# Patient Record
Sex: Female | Born: 1995 | Race: Black or African American | Hispanic: No | Marital: Single | Smoking: Never smoker
Health system: Southern US, Community
[De-identification: ages and names within clinical notes are randomized; demographics above are authoritative.]

## PROBLEM LIST (undated history)

## (undated) DIAGNOSIS — J45909 Unspecified asthma, uncomplicated: Secondary | ICD-10-CM

## (undated) HISTORY — PX: WISDOM TOOTH EXTRACTION: SHX21

## (undated) HISTORY — DX: Unspecified asthma, uncomplicated: J45.909

---
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
## 2015-11-02 ENCOUNTER — Encounter: Payer: Self-pay | Admitting: Certified Nurse Midwife

## 2015-11-02 ENCOUNTER — Ambulatory Visit (INDEPENDENT_AMBULATORY_CARE_PROVIDER_SITE_OTHER): Payer: 59 | Admitting: Certified Nurse Midwife

## 2015-11-02 VITALS — BP 110/60 | HR 70 | Resp 16 | Ht 62.75 in | Wt 185.0 lb

## 2015-11-02 DIAGNOSIS — Z0000 Encounter for general adult medical examination without abnormal findings: Secondary | ICD-10-CM | POA: Diagnosis not present

## 2015-11-02 DIAGNOSIS — Z01419 Encounter for gynecological examination (general) (routine) without abnormal findings: Secondary | ICD-10-CM | POA: Diagnosis not present

## 2015-11-02 DIAGNOSIS — Z308 Encounter for other contraceptive management: Secondary | ICD-10-CM | POA: Diagnosis not present

## 2015-11-02 DIAGNOSIS — Z113 Encounter for screening for infections with a predominantly sexual mode of transmission: Secondary | ICD-10-CM

## 2015-11-02 LAB — POCT URINALYSIS DIPSTICK
Bilirubin, UA: NEGATIVE
Blood, UA: NEGATIVE
Glucose, UA: NEGATIVE
Ketones, UA: NEGATIVE
Leukocytes, UA: NEGATIVE
Nitrite, UA: NEGATIVE
Protein, UA: NEGATIVE
Urobilinogen, UA: NEGATIVE
pH, UA: 5

## 2015-11-02 LAB — HEMOGLOBIN A1C
Hgb A1c MFr Bld: 5.3 % (ref <5.7)
Mean Plasma Glucose: 105 mg/dL

## 2015-11-02 MED ORDER — ETONOGESTREL-ETHINYL ESTRADIOL 0.12-0.015 MG/24HR VA RING: 1.0000 | VAGINAL_RING | VAGINAL | 12 refills | Status: DC

## 2015-11-02 NOTE — Progress Notes (Signed)
20 y.o. G0P0000 Single  African American Fe here to establish gyn care, first gyn exam, and  for annual exam. Periods usually regular, but last period a little late. Duration 6 days, heavy day 1-3 and remainder light, tampons usually. Cramping. Contraception condoms. Previous contraception OCP with Pediatrician and was taking continuous pills, then switched on Microgestin 1/20 worked fairly well. Stopped because she aged out of practice.Sexually active with partner change desires STD screening. Desires contraception change ? Nuvaring. Sees Urgent care if needed.  Has been working on weight loss, down 20 pounds. No other health issues. Going back to college soon at [employer].  Patient's last menstrual period was 10/21/2015 (exact date).          Sexually active: Yes.    The current method of family planning is condoms sometimes.    Exercising: Yes.    weights & cardio Smoker:  no  Health Maintenance: Pap:  none MMG:  none Colonoscopy:  none BMD:   none TDaP:  2015 Shingles: no Pneumonia: no Hep C and HIV: not done Labs: poct urine, hgb-11.5 Self breast exam: not done   reports that she has never smoked. She has never used smokeless tobacco. She reports that she does not drink alcohol or use drugs.  Past Medical History:  Diagnosis Date  . Asthma     Past Surgical History:  Procedure Laterality Date  . WISDOM TOOTH EXTRACTION      No current outpatient prescriptions on file.   No current facility-administered medications for this visit.     History reviewed. No pertinent family history.  ROS:  Pertinent items are noted in HPI.  Otherwise, a comprehensive ROS was negative.  Exam:   BP 110/60   Pulse 70   Resp 16   Ht 5' 2.75" (1.594 m)   Wt 185 lb (83.9 kg)   LMP 10/21/2015 (Exact Date)   BMI 33.03 kg/m  Height: 5' 2.75" (159.4 cm) Ht Readings from Last 3 Encounters:  11/02/15 5' 2.75" (1.594 m) (27 %, Z= -0.61)*   * Growth percentiles are based on CDC 2-20 Years  data.    General appearance: alert, cooperative and appears stated age Head: Normocephalic, without obvious abnormality, atraumatic Neck: no adenopathy, supple, symmetrical, trachea midline and thyroid normal to inspection and palpation Lungs: clear to auscultation bilaterally Breasts: normal appearance, no masses or tenderness, No nipple retraction or dimpling, No nipple discharge or bleeding, No axillary or supraclavicular adenopathy Heart: regular rate and rhythm Abdomen: soft, non-tender; no masses,  no organomegaly Extremities: extremities normal, atraumatic, no cyanosis or edema Skin: Skin color, texture, turgor normal. No rashes or lesions Lymph nodes: Cervical, supraclavicular, and axillary nodes normal. No abnormal inguinal nodes palpated Neurologic: Grossly normal   Pelvic: External genitalia:  no lesions              Urethra:  normal appearing urethra with no masses, tenderness or lesions              Bartholin's and Skene's: normal                 Vagina: normal appearing vagina with normal color and discharge, no lesions              Cervix: no cervical motion tenderness, no lesions and nulliparous appearance              Pap taken: No. Bimanual Exam:  Uterus:  normal size, contour, position, consistency, mobility, non-tender  Adnexa: normal adnexa and no mass, fullness, tenderness               Rectovaginal: Confirms               Anus:  normal   Chaperone present: yes  A:  Well Woman with normal exam  Contraception desired, using condoms  STD screening  P:   Reviewed health and wellness pertinent to exam  Discussed contraceptive options, patient interested in Nuvaring. Discussed risks,benefits and insertion removal. Questions answered at length. Patient inserted and removed Nuvaring without problems. Needs appointment in 3 months for evaluation of use.  Rx Nuvaring see order with instructions.   Lab: HIV,RPR, Affirm/ GC,Chlamydia  Pap smear as above  not taken   counseled on breast self exam, STD prevention, HIV risk factors and prevention, family planning choices, adequate intake of calcium and vitamin D, diet and exercise  return annually or prn  An After Visit Summary was printed and given to the patient.

## 2015-11-02 NOTE — Patient Instructions (Signed)
General topics  Next pap or exam is  due in 1 year Take a Women's multivitamin Take 1200 mg. of calcium daily - prefer dietary If any concerns in interim to call back  Breast Self-Awareness Practicing breast self-awareness may pick up problems early, prevent significant medical complications, and possibly save your life. By practicing breast self-awareness, you can become familiar with how your breasts look and feel and if your breasts are changing. This allows you to notice changes early. It can also offer you some reassurance that your breast health is good. One way to learn what is normal for your breasts and whether your breasts are changing is to do a breast self-exam. If you find a lump or something that was not present in the past, it is best to contact your caregiver right away. Other findings that should be evaluated by your caregiver include nipple discharge, especially if it is bloody; skin changes or reddening; areas where the skin seems to be pulled in (retracted); or new lumps and bumps. Breast pain is seldom associated with cancer (malignancy), but should also be evaluated by a caregiver. BREAST SELF-EXAM The best time to examine your breasts is 5 7 days after your menstrual period is over.  ExitCare Patient Information 2013 [employer] LLC.   Exercise to Stay Healthy Exercise helps you become and stay healthy. EXERCISE IDEAS AND TIPS Choose exercises that:  You enjoy.  Fit into your day. You do not need to exercise really hard to be healthy. You can do exercises at a slow or medium level and stay healthy. You can:  Stretch before and after working out.  Try yoga, Pilates, or tai chi.  Lift weights.  Walk fast, swim, jog, run, climb stairs, bicycle, dance, or rollerskate.  Take aerobic classes. Exercises that burn about 150 calories:  Running 1  miles in 15 minutes.  Playing volleyball for 45 to 60 minutes.  Washing and waxing a car for 45 to 60  minutes.  Playing touch football for 45 minutes.  Walking 1  miles in 35 minutes.  Pushing a stroller 1  miles in 30 minutes.  Playing basketball for 30 minutes.  Raking leaves for 30 minutes.  Bicycling 5 miles in 30 minutes.  Walking 2 miles in 30 minutes.  Dancing for 30 minutes.  Shoveling snow for 15 minutes.  Swimming laps for 20 minutes.  Walking up stairs for 15 minutes.  Bicycling 4 miles in 15 minutes.  Gardening for 30 to 45 minutes.  Jumping rope for 15 minutes.  Washing windows or floors for 45 to 60 minutes. Document Released: 04/27/2010 Document Revised: 06/17/2011 Document Reviewed: 04/27/2010 ExitCare Patient Information 2013 ExitCare[employer].   Other topics ( that may be useful information):    Sexually Transmitted Disease Sexually transmitted disease (STD) refers to any infection that is passed from person to person during sexual activity. This may happen by way of saliva, semen, blood, vaginal mucus, or urine. Common STDs include:  Gonorrhea.  Chlamydia.  Syphilis.  HIV/AIDS.  Genital herpes.  Hepatitis B and C.  Trichomonas.  Human papillomavirus (HPV).  Pubic lice. CAUSES  An STD may be spread by bacteria, virus, or parasite. A person can get an STD by:  Sexual intercourse with an infected person.  Sharing sex toys with an infected person.  Sharing needles with an infected person.  Having intimate contact with the genitals, mouth, or rectal areas of an infected person. SYMPTOMS  Some people may not have any symptoms, but   they can still pass the infection to others. Different STDs have different symptoms. Symptoms include:  Painful or bloody urination.  Pain in the pelvis, abdomen, vagina, anus, throat, or eyes.  Skin rash, itching, irritation, growths, or sores (lesions). These usually occur in the genital or anal area.  Abnormal vaginal discharge.  Penile discharge in men.  Soft, flesh-colored skin growths in the  genital or anal area.  Fever.  Pain or bleeding during sexual intercourse.  Swollen glands in the groin area.  Yellow skin and eyes (jaundice). This is seen with hepatitis. DIAGNOSIS  To make a diagnosis, your caregiver may:  Take a medical history.  Perform a physical exam.  Take a specimen (culture) to be examined.  Examine a sample of discharge under a microscope.  Perform blood test TREATMENT   Chlamydia, gonorrhea, trichomonas, and syphilis can be cured with antibiotic medicine.  Genital herpes, hepatitis, and HIV can be treated, but not cured, with prescribed medicines. The medicines will lessen the symptoms.  Genital warts from HPV can be treated with medicine or by freezing, burning (electrocautery), or surgery. Warts may come back.  HPV is a virus and cannot be cured with medicine or surgery.However, abnormal areas may be followed very closely by your caregiver and may be removed from the cervix, vagina, or vulva through office procedures or surgery. If your diagnosis is confirmed, your recent sexual partners need treatment. This is true even if they are symptom-free or have a negative culture or evaluation. They should not have sex until their caregiver says it is okay. HOME CARE INSTRUCTIONS  All sexual partners should be informed, tested, and treated for all STDs.  Take your antibiotics as directed. Finish them even if you start to feel better.  Only take over-the-counter or prescription medicines for pain, discomfort, or fever as directed by your caregiver.  Rest.  Eat a balanced diet and drink enough fluids to keep your urine clear or pale yellow.  Do not have sex until treatment is completed and you have followed up with your caregiver. STDs should be checked after treatment.  Keep all follow-up appointments, Pap tests, and blood tests as directed by your caregiver.  Only use latex condoms and water-soluble lubricants during sexual activity. Do not use  petroleum jelly or oils.  Avoid alcohol and illegal drugs.  Get vaccinated for HPV and hepatitis. If you have not received these vaccines in the past, talk to your caregiver about whether one or both might be right for you.  Avoid risky sex practices that can break the skin. The only way to avoid getting an STD is to avoid all sexual activity.Latex condoms and dental dams (for oral sex) will help lessen the risk of getting an STD, but will not completely eliminate the risk. SEEK MEDICAL CARE IF:   You have a fever.  You have any new or worsening symptoms. Document Released: 06/15/2002 Document Revised: 06/17/2011 Document Reviewed: 06/22/2010 [facility] Patient Information 2013 [place].    Domestic Abuse You are being battered or abused if someone close to you hits, pushes, or physically hurts you in any way. You also are being abused if you are forced into activities. You are being sexually abused if you are forced to have sexual contact of any kind. You are being emotionally abused if you are made to feel worthless or if you are constantly threatened. It is important to remember that help is available. No one has the right to abuse you. PREVENTION OF FURTHER  ABUSE  Learn the warning signs of danger. This varies with situations but may include: the use of alcohol, threats, isolation from friends and family, or forced sexual contact. Leave if you feel that violence is going to occur.  If you are attacked or beaten, report it to the police so the abuse is documented. You do not have to press charges. The police can protect you while you or the attackers are leaving. Get the officer's name and badge number and a copy of the report.  Find someone you can trust and tell them what is happening to you: your caregiver, a nurse, clergy member, close friend or family member. Feeling ashamed is natural, but remember that you have done nothing wrong. No one deserves abuse. Document Released:  03/22/2000 Document Revised: 06/17/2011 Document Reviewed: 05/31/2010 ExitCare Patient Information 2013 [employer]Care[employer].    How Much is Too Much Alcohol? Drinking too much alcohol can cause injury, accidents, and health problems. These types of problems can include:   Car crashes.  Falls.  Family fighting (domestic violence).  Drowning.  Fights.  Injuries.  Burns.  Damage to certain organs.  Having a baby with birth defects. ONE DRINK CAN BE TOO MUCH WHEN YOU ARE:  Working.  Pregnant or breastfeeding.  Taking medicines. Ask your doctor.  Driving or planning to drive. If you or someone you know has a drinking problem, get help from a doctor.  Document Released: 01/19/2009 Document Revised: 06/17/2011 Document Reviewed: 01/19/2009 ExitCare Patient Information 2013 ExitCare[employer].   Smoking Hazards Smoking cigarettes is extremely bad for your health. Tobacco smoke has over 200 known poisons in it. There are over 60 chemicals in tobacco smoke that cause cancer. Some of the chemicals found in cigarette smoke include:   Cyanide.  Benzene.  Formaldehyde.  Methanol (wood alcohol).  Acetylene (fuel used in welding torches).  Ammonia. Cigarette smoke also contains the poisonous gases nitrogen oxide and carbon monoxide.  Cigarette smokers have an increased risk of many serious medical problems and Smoking causes approximately:  90% of all lung cancer deaths in men.  80% of all lung cancer deaths in women.  90% of deaths from chronic obstructive lung disease. Compared with nonsmokers, smoking increases the risk of:  Coronary heart disease by 2 to 4 times.  Stroke by 2 to 4 times.  Men developing lung cancer by 23 times.  Women developing lung cancer by 13 times.  Dying from chronic obstructive lung diseases by 12 times.  . Smoking is the most preventable cause of death and disease in our society.  WHY IS SMOKING ADDICTIVE?  Nicotine is the chemical  agent in tobacco that is capable of causing addiction or dependence.  When you smoke and inhale, nicotine is absorbed rapidly into the bloodstream through your lungs. Nicotine absorbed through the lungs is capable of creating a powerful addiction. Both inhaled and non-inhaled nicotine may be addictive.  Addiction studies of cigarettes and spit tobacco show that addiction to nicotine occurs mainly during the teen years, when young people begin using tobacco products. WHAT ARE THE BENEFITS OF QUITTING?  There are many health benefits to quitting smoking.   Likelihood of developing cancer and heart disease decreases. Health improvements are seen almost immediately.  Blood pressure, pulse rate, and breathing patterns start returning to normal soon after quitting. QUITTING SMOKING   American Lung Association - 1-800-LUNGUSA  American Cancer Society - 1-800-ACS-2345 Document Released: 05/02/2004 Document Revised: 06/17/2011 Document Reviewed: 01/04/2009 ExitCare Patient Information 2013 ExitCare,   LLC.   Stress Management Stress is a state of physical or mental tension that often results from changes in your life or normal routine. Some common causes of stress are:  Death of a loved one.  Injuries or severe illnesses.  Getting fired or changing jobs.  Moving into a new home. Other causes may be:  Sexual problems.  Business or financial losses.  Taking on a large debt.  Regular conflict with someone at home or at work.  Constant tiredness from lack of sleep. It is not just bad things that are stressful. It may be stressful to:  Win the lottery.  Get married.  Buy a new car. The amount of stress that can be easily tolerated varies from person to person. Changes generally cause stress, regardless of the types of change. Too much stress can affect your health. It may lead to physical or emotional problems. Too little stress (boredom) may also become stressful. SUGGESTIONS TO  REDUCE STRESS:  Talk things over with your family and friends. It often is helpful to share your concerns and worries. If you feel your problem is serious, you may want to get help from a professional counselor.  Consider your problems one at a time instead of lumping them all together. Trying to take care of everything at once may seem impossible. List all the things you need to do and then start with the most important one. Set a goal to accomplish 2 or 3 things each day. If you expect to do too many in a single day you will naturally fail, causing you to feel even more stressed.  Do not use alcohol or drugs to relieve stress. Although you may feel better for a short time, they do not remove the problems that caused the stress. They can also be habit forming.  Exercise regularly - at least 3 times per week. Physical exercise can help to relieve that "uptight" feeling and will relax you.  The shortest distance between despair and hope is often a good night's sleep.  Go to bed and get up on time allowing yourself time for appointments without being rushed.  Take a short "time-out" period from any stressful situation that occurs during the day. Close your eyes and take some deep breaths. Starting with the muscles in your face, tense them, hold it for a few seconds, then relax. Repeat this with the muscles in your neck, shoulders, hand, stomach, back and legs.  Take good care of yourself. Eat a balanced diet and get plenty of rest.  Schedule time for having fun. Take a break from your daily routine to relax. HOME CARE INSTRUCTIONS   Call if you feel overwhelmed by your problems and feel you can no longer manage them on your own.  Return immediately if you feel like hurting yourself or someone else. Document Released: 09/18/2000 Document Revised: 06/17/2011 Document Reviewed: 05/11/2007 [facility] Patient Information 2013 [place].  Ethinyl Estradiol; Etonogestrel vaginal ring What is  this medicine? ETHINYL ESTRADIOL; ETONOGESTREL (ETH in il es tra DYE ole; et oh noe JES trel) vaginal ring is a flexible, vaginal ring used as a contraceptive (birth control method). This medicine combines two types of female hormones, an estrogen and a progestin. This ring is used to prevent ovulation and pregnancy. Each ring is effective for one month. This medicine may be used for other purposes; ask your health care provider or pharmacist if you have questions. What should I tell my health care provider before I take  this medicine? They need to know if you have or ever had any of these conditions: -abnormal vaginal bleeding -blood vessel disease or blood clots -breast, cervical, endometrial, ovarian, liver, or uterine cancer -diabetes -gallbladder disease -heart disease or recent heart attack -high blood pressure -high cholesterol -kidney disease -liver disease -migraine headaches -stroke -systemic lupus erythematosus (SLE) -tobacco smoker -an unusual or allergic reaction to estrogens, progestins, other medicines, foods, dyes, or preservatives -pregnant or trying to get pregnant -breast-feeding How should I use this medicine? Insert the ring into your vagina as directed. Follow the directions on the prescription label. The ring will remain place for 3 weeks and is then removed for a 1-week break. A new ring is inserted 1 week after the last ring was removed, on the same day of the week. Do not use more often than directed. A patient package insert for the product will be given with each prescription and refill. Read this sheet carefully each time. The sheet may change frequently. Contact your pediatrician regarding the use of this medicine in children. Special care may be needed. This medicine has been used in female children who have started having menstrual periods. Overdosage: If you think you have taken too much of this medicine contact a poison control center or emergency room at  once. NOTE: This medicine is only for you. Do not share this medicine with others. What if I miss a dose? You will need to replace your vaginal ring once a month as directed. If the ring should slip out, or if you leave it in longer or shorter than you should, contact your health care professional for advice. What may interact with this medicine? -acetaminophen -antibiotics or medicines for infections, especially rifampin, rifabutin, rifapentine, and griseofulvin, and possibly penicillins or tetracyclines -aprepitant -ascorbic acid (vitamin C) -atorvastatin -barbiturate medicines, such as phenobarbital -bosentan -carbamazepine -caffeine -clofibrate -cyclosporine -dantrolene -doxercalciferol -felbamate -grapefruit juice -hydrocortisone -medicines for anxiety or sleeping problems, such as diazepam or temazepam -medicines for diabetes, including pioglitazone -modafinil -mycophenolate -nefazodone -oxcarbazepine -phenytoin -prednisolone -ritonavir or other medicines for HIV infection or AIDS -rosuvastatin -selegiline -soy isoflavones supplements -St. John's wort -tamoxifen or raloxifene -theophylline -thyroid hormones -topiramate -warfarin This list may not describe all possible interactions. Give your health care provider a list of all the medicines, herbs, non-prescription drugs, or dietary supplements you use. Also tell them if you smoke, drink alcohol, or use illegal drugs. Some items may interact with your medicine. What should I watch for while using this medicine? Visit your doctor or health care professional for regular checks on your progress. You will need a regular breast and pelvic exam and Pap smear while on this medicine. Use an additional method of contraception during the first cycle that you use this ring. If you have any reason to think you are pregnant, stop using this medicine right away and contact your doctor or health care professional. If you are using  this medicine for hormone related problems, it may take several cycles of use to see improvement in your condition. Smoking increases the risk of getting a blood clot or having a stroke while you are using hormonal birth control, especially if you are more than 20 years old. You are strongly advised not to smoke. This medicine can make your body retain fluid, making your fingers, hands, or ankles swell. Your blood pressure can go up. Contact your doctor or health care professional if you feel you are retaining fluid. This medicine can make you more sensitive to   the sun. Keep out of the sun. If you cannot avoid being in the sun, wear protective clothing and use sunscreen. Do not use sun lamps or tanning beds/booths. If you wear contact lenses and notice visual changes, or if the lenses begin to feel uncomfortable, consult your eye care specialist. In some women, tenderness, swelling, or minor bleeding of the gums may occur. Notify your dentist if this happens. Brushing and flossing your teeth regularly may help limit this. See your dentist regularly and inform your dentist of the medicines you are taking. If you are going to have elective surgery, you may need to stop using this medicine before the surgery. Consult your health care professional for advice. This medicine does not protect you against HIV infection (AIDS) or any other sexually transmitted diseases. What side effects may I notice from receiving this medicine? Side effects that you should report to your doctor or health care professional as soon as possible: -breast tissue changes or discharge -changes in vaginal bleeding during your period or between your periods -chest pain -coughing up blood -dizziness or fainting spells -headaches or migraines -leg, arm or groin pain -severe or sudden headaches -stomach pain (severe) -sudden shortness of breath -sudden loss of coordination, especially on one side of the body -speech  problems -symptoms of vaginal infection like itching, irritation or unusual discharge -tenderness in the upper abdomen -vomiting -weakness or numbness in the arms or legs, especially on one side of the body -yellowing of the eyes or skin Side effects that usually do not require medical attention (report to your doctor or health care professional if they continue or are bothersome): -breakthrough bleeding and spotting that continues beyond the 3 initial cycles of pills -breast tenderness -mood changes, anxiety, depression, frustration, anger, or emotional outbursts -increased sensitivity to sun or ultraviolet light -nausea -skin rash, acne, or brown spots on the skin -weight gain (slight) This list may not describe all possible side effects. Call your doctor for medical advice about side effects. You may report side effects to FDA at 1-800-FDA-1088. Where should I keep my medicine? Keep out of the reach of children. Store at room temperature between 15 and 30 degrees C (59 and 86 degrees F) for up to 4 months. The product will expire after 4 months. Protect from light. Throw away any unused medicine after the expiration date. NOTE: This sheet is a summary. It may not cover all possible information. If you have questions about this medicine, talk to your doctor, pharmacist, or health care provider.    2016, [employer]. (2008-03-10 12:03:58)

## 2015-11-03 ENCOUNTER — Telehealth: Payer: Self-pay

## 2015-11-03 LAB — WET PREP BY MOLECULAR PROBE
Candida species: NEGATIVE
Gardnerella vaginalis: POSITIVE — AB
Trichomonas vaginosis: NEGATIVE

## 2015-11-03 LAB — HIV ANTIBODY (ROUTINE TESTING W REFLEX): HIV 1&2 Ab, 4th Generation: NONREACTIVE

## 2015-11-03 LAB — FLUORESCENT TREPONEMAL AB(FTA)-IGG-BLD: Fluorescent Treponemal ABS: NONREACTIVE

## 2015-11-03 LAB — RPR TITER

## 2015-11-03 LAB — RPR: RPR Ser Ql: REACTIVE — AB

## 2015-11-03 LAB — HEMOGLOBIN, FINGERSTICK: Hemoglobin, fingerstick: 11.5 g/dL — ABNORMAL LOW (ref 12.0–16.0)

## 2015-11-03 NOTE — Telephone Encounter (Signed)
Spoke with patient. Advised of message and results as seen below from [name] CNM. She is agreeable and verbalizes understanding. She will contact the office with the first day of her next menses so that rx for Metrogel can be called into the pharmacy for her to start after her menses.  Routing to provider for final review. Patient agreeable to disposition. Will close encounter.

## 2015-11-03 NOTE — Telephone Encounter (Signed)
-----   Message from Verner Chol, CNM sent at 11/03/2015  3:46 PM EDT ----- Notify patient that RPR was reactive, but confirmatory test was FTA was not reactive and titer was low 1:1 HIV negative Hgb A1-c is normal, no indication of diabetes now, continue with weight loss to prevent change Affirm showed negative for yeast, trichomonas. Positive for BV. Will need to call with period for treatment with Metrogel, no order placed Gc,Chlamydia pending Hgb. Discussed at visit

## 2015-11-06 ENCOUNTER — Other Ambulatory Visit: Payer: Self-pay | Admitting: Certified Nurse Midwife

## 2015-11-06 DIAGNOSIS — A749 Chlamydial infection, unspecified: Secondary | ICD-10-CM

## 2015-11-06 LAB — IPS N GONORRHOEA AND CHLAMYDIA BY PCR

## 2015-11-06 NOTE — Progress Notes (Signed)
Encounter reviewed Indya Oliveria, MD   

## 2015-11-07 ENCOUNTER — Other Ambulatory Visit: Payer: Self-pay | Admitting: Certified Nurse Midwife

## 2015-11-07 ENCOUNTER — Telehealth: Payer: Self-pay | Admitting: *Deleted

## 2015-11-07 ENCOUNTER — Telehealth: Payer: Self-pay | Admitting: Emergency Medicine

## 2015-11-07 DIAGNOSIS — A749 Chlamydial infection, unspecified: Secondary | ICD-10-CM

## 2015-11-07 MED ORDER — AZITHROMYCIN 1 G PO PACK: 1.0000 | PACK | Freq: Once | ORAL | 0 refills | Status: AC

## 2015-11-07 MED ORDER — AZITHROMYCIN 1 G PO PACK: 1.0000 | PACK | Freq: Once | ORAL | 0 refills | Status: DC

## 2015-11-07 NOTE — Telephone Encounter (Signed)
Patient called back and said that Zithromax was not at pharmacy. Called [facility] and order resent electronically.

## 2015-11-07 NOTE — Telephone Encounter (Signed)
-----   Message from Verner Chol, CNM sent at 11/06/2015  8:23 PM EDT ----- Notify patient that Gc is negative Chlamydia positive and needs treatment. Partner will need treatment also. Please schedule follow up screening in 3 months.  Order placed for Azithromycin

## 2015-11-07 NOTE — Telephone Encounter (Signed)
Patient returned call and would like Azithromycin sent to local pharmacy. Rx sent and patient aware.  Called [facility] and prescription was cancelled.   Routing to provider for final review. Patient agreeable to disposition. Will close encounter.

## 2015-11-07 NOTE — Telephone Encounter (Signed)
Message from provider given and advised of positive Chlamydia Results.  Advised rx was sent to pharmacy, should take treatment as directed, ensure to take with food. Azithromycin 1 gram PO take at once with food sent to [facility].   Advised will need to complete treatment and notify partner(s). Stressed need to notify partner and abstain until treatment. To wait seven days after they have completed treatment.   Patient declined to schedule test of cure at this time, she reports she will be in school and unsure when she will be available.   Advised reportable condition and that report would be sent to health department.Confidential communicable disease report-Part one completed and faxed to [facility], form [facility], faxed with fax confirmation received and original sent to medical records.   Patient did not have any questions regarding testing/treatment at this time. Advised to call back with any, she is agreeable and verbalized understanding for very important need for treatment and follow up.   Patient will call back if she would like Azithromycin 1 gram sent to local pharmacy versus mail order.

## 2015-11-20 ENCOUNTER — Telehealth: Payer: Self-pay | Admitting: Certified Nurse Midwife

## 2015-11-20 MED ORDER — METRONIDAZOLE 0.75 % VA GEL: 1.0000 | Freq: Every day | VAGINAL | 0 refills | Status: DC

## 2015-11-20 NOTE — Telephone Encounter (Signed)
Patient states instructed to call at onset of menstrual cycle to receive medication for bacterial vaginosis.  Patient states started Saturday.   [facility] [address]

## 2015-11-20 NOTE — Telephone Encounter (Signed)
Spoke with patient. Patient states that she started her menses on 11/18/2015 and is calling for her rx for BV please see note below. Rx for Metrogel 1 applicators qhs x 5 days 0RF sent to pharmacy on file. Advised patient she will not start this medication until her menses ends. She is agreeable and verbalizes understanding.  Notes Recorded by Jannet AskewKaitlyn E Hines, RN on 11/03/2015 at 4:07 PM EDT Spoke with patient. Advised of message and results as seen below from Leota Sauerseborah Leonard CNM. She is agreeable and verbalizes understanding. She will contact the office with the first day of her next menses so that rx for Metrogel can be called into the pharmacy for her to start after her menses. ------  Notes Recorded by Verner Choleborah S Leonard, CNM on 11/03/2015 at 3:46 PM EDT Notify patient that RPR was reactive, but confirmatory test was FTA was not reactive and titer was low 1:1 HIV negative Hgb A1-c is normal, no indication of diabetes now, continue with weight loss to prevent change Affirm showed negative for yeast, trichomonas. Positive for BV. Will need to call with period for treatment with Metrogel, no order placed Gc,Chlamydia pending Hgb. Discussed at visit  Routing to provider for final review. Patient agreeable to disposition. Will close encounter.

## 2015-11-24 ENCOUNTER — Telehealth: Payer: Self-pay

## 2015-11-24 NOTE — Telephone Encounter (Signed)
Reviewed/agree

## 2015-11-24 NOTE — Telephone Encounter (Signed)
Pt previously declined to schedule 3mth toc for chlamydia & also 3mth recheck of nuvaring. I called patient today & pt states she is away at school so she will have to wait until sometime in November & she will call to have follow up done. Pt aware of importance of follow up. Close encounter when done.

## 2015-11-28 ENCOUNTER — Telehealth: Payer: Self-pay | Admitting: Certified Nurse Midwife

## 2015-11-28 NOTE — Telephone Encounter (Signed)
Patient called and requested to speak with the nurse. She said, "I am using Nuvaring but I have had my cycle for the last two weeks."  Last seen 11/02/15

## 2015-11-28 NOTE — Telephone Encounter (Signed)
Spoke with patient. Patient states that she started her menses on 11/18/2015 and placed her first Nuvaring that day. Reports her menses lighted, but is still having some spotting intermittently throughout the day. Denies any heavy bleeding. Advised patient it is common to have some irregular bleeding during the first 3 months on a new birth control. Advised it is important to ensure she removes her Nuvaring and replaces it on time. Advised if irregular bleeding persists after removing and replacing her next Nuvaring she will need to contact the office. Advised if bleeding becomes heavy or develops any new symptoms will also need to contact the office. She is agreeable an verbalizes understanding.  Routing to provider for final review. Patient agreeable to disposition. Will close encounter.

## 2015-12-07 ENCOUNTER — Telehealth: Payer: Self-pay | Admitting: Certified Nurse Midwife

## 2015-12-07 NOTE — Telephone Encounter (Signed)
Returned call to patient. Patient states she started the a new birth control (nuvaring) on 08/12 and ever since then she has been bleeding. She states she only changes her pad/tampon 2-3 times a day and doesn't describe the bleeding as heavy, but she states,"I have no energy because I've been bleeding for so long." Patient voiced concern that she is almost certain she has a yeast infection as well. Advised patient that [name] is out of the office week and she is agreeable to schedule with Ria CommentPatricia Grubb, FNP who is covering for [name]. Office visit offered for 12/08/15, but patient declined stating she is in school [facility] [employer] and will be in class all day tomorrow. Office visit scheduled for Tuesday 12/12/15 at 1245 with Ria CommentPatricia Grubb, FNP. Patient agreeable to date and time.   Routing to provider for review.    Cc Leota Sauerseborah Leonard, CNM

## 2015-12-07 NOTE — Telephone Encounter (Signed)
Patient thinks she having some side effects from the Nuvoring. Patient has been sleeping for twelve hours at a time with little energy. Patient said "I have been on my period for three weeks."

## 2015-12-12 ENCOUNTER — Telehealth: Payer: Self-pay | Admitting: *Deleted

## 2015-12-12 ENCOUNTER — Ambulatory Visit (INDEPENDENT_AMBULATORY_CARE_PROVIDER_SITE_OTHER): Payer: 59 | Admitting: Nurse Practitioner

## 2015-12-12 ENCOUNTER — Encounter: Payer: Self-pay | Admitting: Nurse Practitioner

## 2015-12-12 VITALS — BP 110/70 | HR 72 | Temp 98.7°F | Resp 14 | Ht 62.75 in | Wt 182.0 lb

## 2015-12-12 DIAGNOSIS — N760 Acute vaginitis: Secondary | ICD-10-CM | POA: Diagnosis not present

## 2015-12-12 DIAGNOSIS — N939 Abnormal uterine and vaginal bleeding, unspecified: Secondary | ICD-10-CM | POA: Diagnosis not present

## 2015-12-12 LAB — POCT URINE PREGNANCY: PREG TEST UR: NEGATIVE

## 2015-12-12 LAB — HEMOGLOBIN, FINGERSTICK: HEMOGLOBIN, FINGERSTICK: 12.2 g/dL (ref 12.0–16.0)

## 2015-12-12 MED ORDER — METRONIDAZOLE 500 MG PO TABS: 500.0000 mg | ORAL_TABLET | Freq: Two times a day (BID) | ORAL | 0 refills | Status: DC

## 2015-12-12 NOTE — Progress Notes (Signed)
20 y.o. Single African American female G0P0000 here with complaint of vaginal symptoms of minor itching, burning. Describes that when she stared on Nuva Ring on August 12 with onset of menses she has spotted ever since.  Sometimes only with wiping and other times light flow.  Took Nuva ring out on Saturday and bleeding is heavier like a normal period.  She does have some fatigue and already has been anemic.   Has a vaginal odor that is not menstrual. At her last exam she was diagnosed with BV and has Metrogel on hand but has not used as she is waiting on the BTB to stop.  She is OK with changing to an oral tablet.  Denies new personal products or vaginal dryness.   No STD concerns; same partner 7 months.  Recent STD's 11/02/15 were negative except for BV as noted. Urinary symptoms none . Contraception is [employer] Ring.   O:Healthy female WDWN Affect: normal, orientation x 3  Exam: no distress Abdomen: soft and non tender Lymph node: no enlargement or tenderness Pelvic exam: External genital: normal female BUS: negative Vagina: moderate vaginal bleeding noted. Affirm taken from sidewalls Cervix: normal, non tender, no CMT Uterus: normal, non tender Adnexa:normal, non tender, no masses or fullness noted  HGB: fingerstick today is 12.2   ( In July 11.5) UPT: negative  A: Normal pelvic exam  R/O vaginitis - already noted but will see if yeast is also present  Adjustment to Nuva Ring X first month   P: Discussed findings of BTB and causes.  Will follow with Affirm but will go ahead and change to Flagyl po.  Given SE and warnings about GI and ETOH.   RV prn

## 2015-12-12 NOTE — Patient Instructions (Signed)
Continue Nuva ring as directed to be inserted next Saturday.

## 2015-12-12 NOTE — Telephone Encounter (Signed)
Patient called office to see if she needed to keep her appointment today with Ria CommentPatricia Grubb, FNP. Please see telephone encounter dated 12/07/15 when patient called to discuss irregular bleeding on the nuvaring. Patient states that she took her Nuvaring out on Saturday (09/02) and her period has "come on heavy." Patient unable to describe how heavy her bleeding is. Patient states she just woke up and changed her pad, so she doesn't know how often during the day she will have to change it. No blood clots. RN asked if patient still felt that she had a yeast infection, and patient states, "it smells weird and it doesn't smell like blood." RN advised patient to keep appointment today at 1245 with Ria CommentPatricia Grubb, FNP for further evaluation. Patient agreeable.   Routing to provider for final review. Patient agreeable to disposition. Will close encounter.    Cc Leota Sauerseborah Leonard, CNM

## 2015-12-13 LAB — WET PREP BY MOLECULAR PROBE
CANDIDA SPECIES: NEGATIVE
Gardnerella vaginalis: POSITIVE — AB
TRICHOMONAS VAG: NEGATIVE

## 2015-12-13 NOTE — Progress Notes (Signed)
Encounter reviewed by Dr. Janean SarkBrook Amundson C. Silva.  Chlamydia test positive 11/02/15.   If abnormal bleeding persists, needs repeat GC/CT testing.

## 2016-01-16 ENCOUNTER — Encounter: Payer: Self-pay | Admitting: Nurse Practitioner

## 2016-01-16 ENCOUNTER — Ambulatory Visit (INDEPENDENT_AMBULATORY_CARE_PROVIDER_SITE_OTHER): Payer: 59 | Admitting: Nurse Practitioner

## 2016-01-16 VITALS — BP 100/68 | HR 60 | Resp 14 | Wt 186.0 lb

## 2016-01-16 DIAGNOSIS — N760 Acute vaginitis: Secondary | ICD-10-CM

## 2016-01-16 DIAGNOSIS — Z308 Encounter for other contraceptive management: Secondary | ICD-10-CM | POA: Diagnosis not present

## 2016-01-16 DIAGNOSIS — A749 Chlamydial infection, unspecified: Secondary | ICD-10-CM

## 2016-01-16 MED ORDER — ETONOGESTREL-ETHINYL ESTRADIOL 0.12-0.015 MG/24HR VA RING: 1.0000 | VAGINAL_RING | VAGINAL | 3 refills | Status: DC

## 2016-01-16 NOTE — Patient Instructions (Signed)
Chlamydia Test  WHY AM I HAVING THIS TEST?  This a test to see if you have chlamydia. Chlamydia is a common sexually transmitted disease (STD). Your health care provider may perform this test if you:  · Are sexually active.  · Have another STD.  · Have complaints about pelvic pain, vaginal discharge, or both.  WHAT KIND OF SAMPLE IS TAKEN?  Depending on your symptoms, your health care provider may collect any one of the following samples:  · A blood sample. This is usually collected by inserting a needle into a vein.  · A tissue sample. This is collected by swabbing tissue of the eye, urethra, or cervix.  · A sample of sputum. This is collected by having you cough into a sterile container that is provided by the lab.  HOW DO I PREPARE FOR THE TEST?  There is no preparation required for this test.  HOW ARE THE TEST RESULTS REPORTED?  Your test results will be reported as either positive or negative. It is your responsibility to obtain your test results. Ask the lab or department performing the test when and how you will get your results.  WHAT DO THE RESULTS MEAN?  A positive result means that you have a chlamydia infection.  Talk with your health care provider to discuss your results, treatment options, and if necessary, the need for more tests. Talk with your health care provider if you have any questions about your results.     This information is not intended to replace advice given to you by your health care provider. Make sure you discuss any questions you have with your health care provider.     Document Released: 04/17/2004 Document Revised: 04/15/2014 Document Reviewed: 08/18/2013  Elsevier Interactive Patient Education ©2016 Elsevier Inc.

## 2016-01-16 NOTE — Progress Notes (Signed)
20 y.o. Single African American female G0P0000 here with complaint of vaginal symptoms of increase discharge and to follow up on Nuva Ring and TOC for chlamydia.   Describes discharge as white thick and seems to occur the week before her menses.  She has noted this since being on the [employer].  This is her third month of the Nuva Ring. She likes the convenience of the ring.  She is also here for TOC from a positive Chlamydia in July.  States both were treated and she denies pelvic pain. Denies new personal products or vaginal dryness.  No STD concerns. Urinary symptoms none . Contraception is Nuva ring. She is on the [employer] Ring.  First month had BTB entire month.  She has noted no problems after that with BTB.  Some increase in discharge before cycle that is not burning, itching, or odor and that seems to clear with her cycle.  Same partner 8 month.  O:  Healthy female WDWN Affect: normal, orientation x 3 Exam: no distress Abdomen: soft and non tender Lymph node: no enlargement or tenderness Pelvic exam: External genital: normal female BUS: negative Vagina: white thick discharge noted.  Affirm taken. Cervix: normal, non tender, no CMT Uterus: normal, non tender Adnexa:normal, non tender, no masses or fullness noted    A: R/O Vaginitis  Nuva Ring for BC  TOC with history of Chlamydia in July   P: Discussed findings of vaginitis and etiology. Discussed Aveeno or baking soda sitz bath for comfort. Avoid moist clothes or pads for extended period of time. If working out in gym clothes or swim suits for long periods of time change underwear or bottoms of swimsuit if possible. Olive Oil/Coconut Oil use for skin protection prior to activity can be used to external skin.  Rx: a refill of Nuva ring is given for a year  Follow with Affirm, GC and Chlamydia  RV prn

## 2016-01-17 LAB — WET PREP BY MOLECULAR PROBE
CANDIDA SPECIES: NEGATIVE
Gardnerella vaginalis: NEGATIVE
TRICHOMONAS VAG: NEGATIVE

## 2016-01-17 LAB — GC/CHLAMYDIA PROBE AMP
CT Probe RNA: NOT DETECTED
GC Probe RNA: NOT DETECTED

## 2016-01-19 NOTE — Progress Notes (Signed)
Encounter reviewed by Dr. Brook Amundson C. Silva.  

## 2016-02-13 ENCOUNTER — Telehealth: Payer: Self-pay

## 2016-02-13 NOTE — Telephone Encounter (Signed)
Pt came in & saw pg for her toc of chlamydia & nuvaring recheck 01-16-16

## 2016-02-13 NOTE — Telephone Encounter (Signed)
-----   Message from Joeseph Amorracy L Fast, RN sent at 11/07/2015 10:04 AM EDT ----- Patient positive for Chlamydia 11/07/15 declined to schedule 3 month test of cure and follow up [employer] Start.   Please contact patient to schedule.

## 2018-03-20 ENCOUNTER — Encounter (HOSPITAL_COMMUNITY): Payer: Self-pay | Admitting: Emergency Medicine

## 2018-03-20 ENCOUNTER — Emergency Department (HOSPITAL_COMMUNITY): Payer: 59

## 2018-03-20 ENCOUNTER — Emergency Department (HOSPITAL_COMMUNITY)
Admission: EM | Admit: 2018-03-20 | Discharge: 2018-03-20 | Disposition: A | Discharge status: Home / Self Care | Payer: 59 | Attending: Emergency Medicine | Admitting: Emergency Medicine

## 2018-03-20 DIAGNOSIS — N83292 Other ovarian cyst, left side: Secondary | ICD-10-CM | POA: Insufficient documentation

## 2018-03-20 DIAGNOSIS — Z79899 Other long term (current) drug therapy: Secondary | ICD-10-CM | POA: Diagnosis not present

## 2018-03-20 DIAGNOSIS — R1030 Lower abdominal pain, unspecified: Secondary | ICD-10-CM | POA: Diagnosis present

## 2018-03-20 DIAGNOSIS — R102 Pelvic and perineal pain: Secondary | ICD-10-CM | POA: Diagnosis not present

## 2018-03-20 DIAGNOSIS — J45909 Unspecified asthma, uncomplicated: Secondary | ICD-10-CM | POA: Insufficient documentation

## 2018-03-20 DIAGNOSIS — N83202 Unspecified ovarian cyst, left side: Secondary | ICD-10-CM

## 2018-03-20 LAB — CBC WITH DIFFERENTIAL/PLATELET
Abs Immature Granulocytes: 0.03 10*3/uL (ref 0.00–0.07)
Basophils Absolute: 0 10*3/uL (ref 0.0–0.1)
Basophils Relative: 0 %
EOS PCT: 2 %
Eosinophils Absolute: 0.2 10*3/uL (ref 0.0–0.5)
HCT: 35.9 % — ABNORMAL LOW (ref 36.0–46.0)
Hemoglobin: 11.4 g/dL — ABNORMAL LOW (ref 12.0–15.0)
Immature Granulocytes: 0 %
Lymphocytes Relative: 22 %
Lymphs Abs: 2 10*3/uL (ref 0.7–4.0)
MCH: 30.3 pg (ref 26.0–34.0)
MCHC: 31.8 g/dL (ref 30.0–36.0)
MCV: 95.5 fL (ref 80.0–100.0)
Monocytes Absolute: 0.9 10*3/uL (ref 0.1–1.0)
Monocytes Relative: 11 %
Neutro Abs: 5.7 10*3/uL (ref 1.7–7.7)
Neutrophils Relative %: 65 %
Platelets: 195 10*3/uL (ref 150–400)
RBC: 3.76 MIL/uL — ABNORMAL LOW (ref 3.87–5.11)
RDW: 12.4 % (ref 11.5–15.5)
WBC: 8.8 10*3/uL (ref 4.0–10.5)
nRBC: 0 % (ref 0.0–0.2)

## 2018-03-20 LAB — URINALYSIS, ROUTINE W REFLEX MICROSCOPIC
Bacteria, UA: NONE SEEN
Bilirubin Urine: NEGATIVE
Glucose, UA: NEGATIVE mg/dL
Ketones, ur: 5 mg/dL — AB
Leukocytes, UA: NEGATIVE
Nitrite: NEGATIVE
PH: 6 (ref 5.0–8.0)
Protein, ur: NEGATIVE mg/dL
Specific Gravity, Urine: 1.02 (ref 1.005–1.030)

## 2018-03-20 LAB — COMPREHENSIVE METABOLIC PANEL
ALT: 10 U/L (ref 0–44)
AST: 15 U/L (ref 15–41)
Albumin: 3.8 g/dL (ref 3.5–5.0)
Alkaline Phosphatase: 53 U/L (ref 38–126)
Anion gap: 14 (ref 5–15)
BUN: 12 mg/dL (ref 6–20)
CO2: 21 mmol/L — ABNORMAL LOW (ref 22–32)
Calcium: 9 mg/dL (ref 8.9–10.3)
Chloride: 101 mmol/L (ref 98–111)
Creatinine, Ser: 0.89 mg/dL (ref 0.44–1.00)
GFR calc Af Amer: >60 mL/min (ref >60)
GFR calc non Af Amer: >60 mL/min (ref >60)
Glucose, Bld: 96 mg/dL (ref 70–99)
Potassium: 4 mmol/L (ref 3.5–5.1)
Sodium: 136 mmol/L (ref 135–145)
Total Bilirubin: 0.6 mg/dL (ref 0.3–1.2)
Total Protein: 8 g/dL (ref 6.5–8.1)

## 2018-03-20 LAB — I-STAT BETA HCG BLOOD, ED (MC, WL, AP ONLY): I-stat hCG, quantitative: <5 m[IU]/mL (ref <5)

## 2018-03-20 LAB — WET PREP, GENITAL
Sperm: NONE SEEN
TRICH WET PREP: NONE SEEN
Yeast Wet Prep HPF POC: NONE SEEN

## 2018-03-20 LAB — HIV ANTIBODY (ROUTINE TESTING W REFLEX): HIV Screen 4th Generation wRfx: NONREACTIVE

## 2018-03-20 LAB — LIPASE, BLOOD: Lipase: 28 U/L (ref 11–51)

## 2018-03-20 LAB — GC/CHLAMYDIA PROBE AMP (~~LOC~~) NOT AT ARMC
Chlamydia: NEGATIVE
Neisseria Gonorrhea: NEGATIVE

## 2018-03-20 LAB — RPR: RPR Ser Ql: NONREACTIVE

## 2018-03-20 MED ORDER — MORPHINE SULFATE (PF) 4 MG/ML IV SOLN
4.0000 mg | Freq: Once | INTRAVENOUS | Status: AC
  Administered 2018-03-20: 4 mg via INTRAVENOUS
  Filled 2018-03-20: qty 1

## 2018-03-20 MED ORDER — ONDANSETRON HCL 4 MG/2ML IJ SOLN
4.0000 mg | Freq: Once | INTRAMUSCULAR | Status: AC
  Administered 2018-03-20: 4 mg via INTRAVENOUS
  Filled 2018-03-20: qty 2

## 2018-03-20 MED ORDER — AZITHROMYCIN 250 MG PO TABS
1000.0000 mg | ORAL_TABLET | Freq: Once | ORAL | Status: AC
  Administered 2018-03-20: 1000 mg via ORAL
  Filled 2018-03-20: qty 4

## 2018-03-20 MED ORDER — IBUPROFEN 600 MG PO TABS: 600.0000 mg | ORAL_TABLET | Freq: Four times a day (QID) | ORAL | 0 refills | Status: DC | PRN

## 2018-03-20 MED ORDER — CEFTRIAXONE SODIUM 250 MG IJ SOLR
250.0000 mg | Freq: Once | INTRAMUSCULAR | Status: AC
  Administered 2018-03-20: 250 mg via INTRAMUSCULAR
  Filled 2018-03-20: qty 250

## 2018-03-20 MED ORDER — KETOROLAC TROMETHAMINE 30 MG/ML IJ SOLN
30.0000 mg | Freq: Once | INTRAMUSCULAR | Status: AC
  Administered 2018-03-20: 30 mg via INTRAVENOUS
  Filled 2018-03-20: qty 1

## 2018-03-20 MED ORDER — STERILE WATER FOR INJECTION IJ SOLN
INTRAMUSCULAR | Status: AC
  Administered 2018-03-20: 2 mL
  Filled 2018-03-20: qty 10

## 2018-03-20 NOTE — ED Provider Notes (Signed)
[facility] Provider Note   CSN: [id number] Arrival date & time: 03/20/18  0720     History   Chief Complaint Chief Complaint  Patient presents with  . Abdominal Pain    HPI Laurie Ware is a 22 y.o. female.  The history is provided by the patient. No language interpreter was used.  Abdominal Pain       22 year old female presenting for evaluation of abdominal pain.  Patient states she is currently in the middle of her menstruation for the past 3 to 4 days.  Since yesterday she developed lower abdominal pain.  She described as a cramping sharp sensation across her lower abdomen that has been persistent.  She also endorses nausea and has had 5 episodes of nonbloody nonbilious vomiting with normal stool.  Her pain has since increased in intensity, not improved with taking Tylenol.  Pain is moderate to severe at this time.  Movement seems to worsen the pain.  She denies any associated fever or chills no chest pain or shortness of breath, no dysuria or hematuria.  She does not complain of any vaginal discharge.  Past Medical History:  Diagnosis Date  . Asthma     There are no active problems to display for this patient.   Past Surgical History:  Procedure Laterality Date  . WISDOM TOOTH EXTRACTION       OB History    Gravida  0   Para  0   Term  0   Preterm  0   AB  0   Living  0     SAB  0   TAB  0   Ectopic  0   Multiple  0   Live Births  0            Home Medications    Prior to Admission medications   Medication Sig Start Date End Date Taking? Authorizing Provider  etonogestrel-ethinyl estradiol (NUVARING) 0.12-0.015 MG/24HR vaginal ring Place 1 each vaginally every 28 (twenty-eight) days. Insert vaginally and leave in place for 3 consecutive weeks, then remove for 1 week. 01/16/16   Ria CommentGrubb, Patricia, FNP    Family History Family History  Problem Relation Age of Onset  . Hypertension Father   .  Diabetes Maternal Grandmother   . Breast cancer Paternal Grandmother   . Diabetes Paternal Grandmother     Social History Social History   Tobacco Use  . Smoking status: Never Smoker  . Smokeless tobacco: Never Used  Substance Use Topics  . Alcohol use: No  . Drug use: No     Allergies   Patient has no known allergies.   Review of Systems Review of Systems  Gastrointestinal: Positive for abdominal pain.  All other systems reviewed and are negative.    Physical Exam Updated Vital Signs BP 109/66   Pulse 81   Temp 98.2 F (36.8 C) (Oral)   LMP 03/16/2018   SpO2 99%   Physical Exam Vitals signs and nursing note reviewed.  Constitutional:      General: She is not in acute distress.    Appearance: She is well-developed.  HENT:     Head: Atraumatic.  Eyes:     Conjunctiva/sclera: Conjunctivae normal.  Neck:     Musculoskeletal: Neck supple.  Cardiovascular:     Rate and Rhythm: Normal rate and regular rhythm.     Heart sounds: Normal heart sounds.  Pulmonary:     Effort: Pulmonary effort is normal.  Breath sounds: Normal breath sounds.  Abdominal:     Palpations: Abdomen is soft.     Tenderness: There is abdominal tenderness in the right lower quadrant, suprapubic area and left lower quadrant.  Genitourinary:    Comments: Chaperone present during exam.  No inguinal lymphadenopathy or inguinal hernia noted.  Normal external genitalia.  Pain with speculum insertion.  Moderate amount of blood noted in vaginal vault.  Cervical os is closed.  No significant vaginal discharge noted.  On bimanual examination, exquisite tenderness to left adnexal region with cervical motion tenderness.  No obvious mass appreciated however exam is limited due to large body habitus. Skin:    Findings: No rash.  Neurological:     Mental Status: She is alert.      ED Treatments / Results  Labs (all labs ordered are listed, but only abnormal results are displayed) Labs Reviewed    WET PREP, GENITAL - Abnormal; Notable for the following components:      Result Value   Clue Cells Wet Prep HPF POC PRESENT (*)    WBC, Wet Prep HPF POC MANY (*)    All other components within normal limits  CBC WITH DIFFERENTIAL/PLATELET - Abnormal; Notable for the following components:   RBC 3.76 (*)    Hemoglobin 11.4 (*)    HCT 35.9 (*)    All other components within normal limits  COMPREHENSIVE METABOLIC PANEL - Abnormal; Notable for the following components:   CO2 21 (*)    All other components within normal limits  LIPASE, BLOOD  URINALYSIS, ROUTINE W REFLEX MICROSCOPIC  RPR  HIV ANTIBODY (ROUTINE TESTING W REFLEX)  I-STAT BETA HCG BLOOD, ED ([facility], [facility], [facility] ONLY)  GC/CHLAMYDIA PROBE AMP ([place]) NOT AT [facility]    EKG None  Radiology US Transvaginal Non-ob  Result Date: 03/20/2018 CLINICAL DATA:  Pelvic pain.  Vaginal bleeding. EXAM: TRANSABDOMINAL AND TRANSVAGINAL ULTRASOUND OF PELVIS DOPPLER ULTRASOUND OF OVARIES TECHNIQUE: Both transabdominal and transvaginal ultrasound examinations of the pelvis were performed. Transabdominal technique was performed for global imaging of the pelvis including uterus, ovaries, adnexal regions, and pelvic cul-de-sac. It was necessary to proceed with endovaginal exam following the transabdominal exam to visualize the endometrium and ovaries. Color and duplex Doppler ultrasound was utilized to evaluate blood flow to the ovaries. COMPARISON:  None. FINDINGS: Uterus Measurements: 9.0 x 3.9 x 5.0 cm = volume: 92.3 mL. No fibroids or other mass visualized. Endometrium Thickness: 3.9 mm.  No focal abnormality visualized. Right ovary Measurements: 5.0 x 2.8 x 3.5 cm = volume: 25.6 mL. Normal appearance/no adnexal mass. Left ovary Measurements: 4.2 x 3.1 x 4.2 cm = volume: 29.1 mL. Contains 2 nearly isoechoic masses measuring 3.1 x 2.7 x 3.2 cm and 1.5 x 1.0 x 1.5 cm. There is normal appearing tissue surrounding these 2 masses. Pulsed Doppler evaluation of  both ovaries demonstrates normal low-resistance arterial and venous waveforms. Other findings There is a small amount of free fluid in the pelvis. IMPRESSION: 1. Two nearly isoechoic masses are seen in the left ovary. These most likely either represent endometriomas or hemorrhagic cysts. Given the possibility of endometriomas, recommend a follow-up ultrasound in 6-12 weeks. Electronically Signed   By: Gerome Sam III M.D   On: 03/20/2018 09:27   US Pelvis Complete  Result Date: 03/20/2018 CLINICAL DATA:  Pelvic pain.  Vaginal bleeding. EXAM: TRANSABDOMINAL AND TRANSVAGINAL ULTRASOUND OF PELVIS DOPPLER ULTRASOUND OF OVARIES TECHNIQUE: Both transabdominal and transvaginal ultrasound examinations of the pelvis were performed. Transabdominal  technique was performed for global imaging of the pelvis including uterus, ovaries, adnexal regions, and pelvic cul-de-sac. It was necessary to proceed with endovaginal exam following the transabdominal exam to visualize the endometrium and ovaries. Color and duplex Doppler ultrasound was utilized to evaluate blood flow to the ovaries. COMPARISON:  None. FINDINGS: Uterus Measurements: 9.0 x 3.9 x 5.0 cm = volume: 92.3 mL. No fibroids or other mass visualized. Endometrium Thickness: 3.9 mm.  No focal abnormality visualized. Right ovary Measurements: 5.0 x 2.8 x 3.5 cm = volume: 25.6 mL. Normal appearance/no adnexal mass. Left ovary Measurements: 4.2 x 3.1 x 4.2 cm = volume: 29.1 mL. Contains 2 nearly isoechoic masses measuring 3.1 x 2.7 x 3.2 cm and 1.5 x 1.0 x 1.5 cm. There is normal appearing tissue surrounding these 2 masses. Pulsed Doppler evaluation of both ovaries demonstrates normal low-resistance arterial and venous waveforms. Other findings There is a small amount of free fluid in the pelvis. IMPRESSION: 1. Two nearly isoechoic masses are seen in the left ovary. These most likely either represent endometriomas or hemorrhagic cysts. Given the possibility of  endometriomas, recommend a follow-up ultrasound in 6-12 weeks. Electronically Signed   By: Gerome Sam III M.D   On: 03/20/2018 09:27   [place] Art/ven Flow Abd Pelv Doppler  Result Date: 03/20/2018 CLINICAL DATA:  Pelvic pain.  Vaginal bleeding. EXAM: TRANSABDOMINAL AND TRANSVAGINAL ULTRASOUND OF PELVIS DOPPLER ULTRASOUND OF OVARIES TECHNIQUE: Both transabdominal and transvaginal ultrasound examinations of the pelvis were performed. Transabdominal technique was performed for global imaging of the pelvis including uterus, ovaries, adnexal regions, and pelvic cul-de-sac. It was necessary to proceed with endovaginal exam following the transabdominal exam to visualize the endometrium and ovaries. Color and duplex Doppler ultrasound was utilized to evaluate blood flow to the ovaries. COMPARISON:  None. FINDINGS: Uterus Measurements: 9.0 x 3.9 x 5.0 cm = volume: 92.3 mL. No fibroids or other mass visualized. Endometrium Thickness: 3.9 mm.  No focal abnormality visualized. Right ovary Measurements: 5.0 x 2.8 x 3.5 cm = volume: 25.6 mL. Normal appearance/no adnexal mass. Left ovary Measurements: 4.2 x 3.1 x 4.2 cm = volume: 29.1 mL. Contains 2 nearly isoechoic masses measuring 3.1 x 2.7 x 3.2 cm and 1.5 x 1.0 x 1.5 cm. There is normal appearing tissue surrounding these 2 masses. Pulsed Doppler evaluation of both ovaries demonstrates normal low-resistance arterial and venous waveforms. Other findings There is a small amount of free fluid in the pelvis. IMPRESSION: 1. Two nearly isoechoic masses are seen in the left ovary. These most likely either represent endometriomas or hemorrhagic cysts. Given the possibility of endometriomas, recommend a follow-up ultrasound in 6-12 weeks. Electronically Signed   By: Gerome Sam III M.D   On: 03/20/2018 09:27    Procedures Procedures (including critical care time)  Medications Ordered in ED Medications  morphine 4 MG/ML injection 4 mg (4 mg Intravenous Given 03/20/18  0810)  ondansetron (ZOFRAN) injection 4 mg (4 mg Intravenous Given 03/20/18 0810)  cefTRIAXone (ROCEPHIN) injection 250 mg (250 mg Intramuscular Given 03/20/18 1039)  azithromycin (ZITHROMAX) tablet 1,000 mg (1,000 mg Oral Given 03/20/18 1036)  ketorolac (TORADOL) 30 MG/ML injection 30 mg (30 mg Intravenous Given 03/20/18 1038)  sterile water (preservative free) injection (2 mLs  Given 03/20/18 1041)     Initial Impression / Assessment and Plan / ED Course  I have reviewed the triage vital signs and the nursing notes.  Pertinent labs & imaging results that were available during my care of the patient  were reviewed by me and considered in my medical decision making (see chart for details).     BP 109/66   Pulse 81   Temp 98.2 F (36.8 C) (Oral)   LMP 03/16/2018   SpO2 99%    Final Clinical Impressions(s) / ED Diagnoses   Final diagnoses:  Pelvic pain in female  Ovarian cyst, left    ED Discharge Orders         Ordered    ibuprofen (ADVIL,MOTRIN) 600 MG tablet  Every 6 hours PRN     03/20/18 0950         7:41 AM Patient here with low abdominal pain with associate nausea and vomiting.  She is currently on her menstruation.  She does have exquisite tenderness to palpation of her lower abdomen most significant in the left lower quadrant.  Work-up initiated, will perform pelvic semination and will either obtain ultrasound versus abdominal CT scan for further evaluation.  8:11 AM Patient with tenderness to her left pelvic region on pelvic semination.  She would benefit from ultrasound of the vaginal region to rule out ovarian torsion, tubo-ovarian abscess, or PID.  Patient given Rocephin and Zithromax as prophylaxis medication for potential STI.   9:47 AM Pregnancy test is negative, wet prep shows presence of clue cells and many WBC, normal CBC, hemoglobin is 11.4, electrolyte panels are reassuring.  Lipase normal, pelvic and transvaginal ultrasound demonstrate to nearly  isoechoic masses are seen in the left ovary.  These most likely either represents endometriomas or hemorrhagic cyst.  Given the possibility of endometriomas, recommend follow-up ultrasound in 6 to 12 weeks.  I did discuss this finding with patient.  I strongly encourage patient to have follow-up ultrasound in 6 to 12 weeks.  Otherwise, recommend anti-inflammatory medication for her pain.  Return precaution discussed.   Fayrene Helper, PA-C 03/20/18 1049    Gerhard Munch, MD 03/20/18 [phone]

## 2018-03-20 NOTE — Discharge Instructions (Addendum)
You have been evaluated for your lower pelvic pain.  Ultrasound shows two ovarian mass on the left side.  This is likely hemorrhagic ovarian cysts causing your pain.  Most ovarian cysts will improve over time.  Take ibuprofen as needed for pain.  Please follow up with your doctor and request a repeat ultrasound in 6-12 weeks to ensure resolution of your ovarian cysts.  Return if you have any concerns.

## 2018-03-20 NOTE — ED Triage Notes (Signed)
Patient to ER for evaluation of lower abdominal pain, left and right lower quadrant with significant tenderness on palpation x4 days. Reports she is currently on her menstrual cycle but states this feels worse than usual and she is "coming off of it." Reports nausea and vomiting onset yesterday as well as diarrhea. Denies urinary complaints.

## 2018-10-27 ENCOUNTER — Telehealth: Payer: Self-pay | Admitting: Internal Medicine

## 2018-10-27 NOTE — Telephone Encounter (Signed)
Dr. Quay Burow has accepted this patient to establish. Okay to schedule New Patient appointment with her.

## 2018-11-01 NOTE — Progress Notes (Signed)
Subjective:    Patient ID: Laurie Ware, female    DOB: 1996-03-19, 23 y.o.   MRN: [id number]  HPI  She is here to establish with a new pcp. She is here for a physical exam.    She works at night and sleeps during the day and probably only gets 4-5 hours of sleep.  She has had low iron in the past and thinks that is probably contributing to her low energy level as well.  She was started on Loestrin Fe 3 months ago.  She has not tolerated oral supplemental iron, even Slow Fe.    Medications and allergies reviewed with patient and updated if appropriate.  Patient Active Problem List   Diagnosis Date Noted  . Family history of diabetes mellitus in mother 11/03/2018  . Bilateral ovarian cysts 11/03/2018  . Eczema 11/03/2018  . Knee pain, bilateral 11/03/2018    Current Outpatient Medications on File Prior to Visit  Medication Sig Dispense Refill  . LO LOESTRIN FE 1 MG-10 MCG / 10 MCG tablet Take 1 tablet by mouth daily.     No current facility-administered medications on file prior to visit.     Past Medical History:  Diagnosis Date  . Asthma     Past Surgical History:  Procedure Laterality Date  . WISDOM TOOTH EXTRACTION      Social History   Socioeconomic History  . Marital status: Single    Spouse name: Not on file  . Number of children: Not on file  . Years of education: Not on file  . Highest education level: Not on file  Occupational History  . Not on file  Social Needs  . Financial resource strain: Not on file  . Food insecurity    Worry: Not on file    Inability: Not on file  . Transportation needs    Medical: Not on file    Non-medical: Not on file  Tobacco Use  . Smoking status: Never Smoker  . Smokeless tobacco: Never Used  Substance and Sexual Activity  . Alcohol use: Yes    Comment: social  . Drug use: No  . Sexual activity: Yes    Partners: Male    Birth control/protection: Condom, Inserts  Lifestyle  . Physical activity    Days  per week: Not on file    Minutes per session: Not on file  . Stress: Not on file  Relationships  . Social Herbalist on phone: Not on file    Gets together: Not on file    Attends religious service: Not on file    Active member of club or organization: Not on file    Attends meetings of clubs or organizations: Not on file    Relationship status: Not on file  Other Topics Concern  . Not on file  Social History Narrative  . Not on file    Family History  Problem Relation Age of Onset  . Hypertension Father   . Diabetes Maternal Grandmother   . Breast cancer Paternal Grandmother   . Diabetes Paternal Grandmother   . Diabetes Mother     Review of Systems  Constitutional: Positive for fatigue (? low iron). Negative for chills and fever.  Eyes: Negative for visual disturbance.  Respiratory: Negative for cough, shortness of breath and wheezing.   Cardiovascular: Negative for chest pain, palpitations and leg swelling.  Gastrointestinal: Positive for abdominal pain (intermittent - ? ovarian cysts). Negative for blood in stool,  constipation, diarrhea and nausea.       No gerd  Genitourinary: Negative for dysuria and hematuria.  Musculoskeletal: Positive for arthralgias (knee crack with pain, hip feel like they go in and out of joint), back pain (upper back pain) and neck pain.  Skin: Positive for wound (recurrent sores right breast). Negative for color change and rash.  Neurological: Negative for light-headedness and headaches.  Psychiatric/Behavioral: Positive for sleep disturbance. Negative for dysphoric mood. The patient is not nervous/anxious.        Objective:   Vitals:   11/03/18 0908  BP: 100/62  Pulse: 66  Resp: 14  Temp: 98.7 F (37.1 C)  SpO2: 99%   Filed Weights   11/03/18 0908  Weight: 202 lb (91.6 kg)   Body mass index is 36.95 kg/m.  BP Readings from Last 3 Encounters:  11/03/18 100/62  03/20/18 109/66  01/16/16 100/68    Wt Readings from  Last 3 Encounters:  11/03/18 202 lb (91.6 kg)  01/16/16 186 lb (84.4 kg)  12/12/15 182 lb (82.6 kg)     Physical Exam Constitutional: She appears well-developed and well-nourished. No distress.  HENT:  Head: Normocephalic and atraumatic.  Right Ear: External ear normal. Normal ear canal and TM Left Ear: External ear normal.  Normal ear canal and TM Mouth/Throat: Oropharynx is clear and moist.  Eyes: Conjunctivae and EOM are normal.  Neck: Neck supple. No tracheal deviation present. No thyromegaly present.  No carotid bruit  Cardiovascular: Normal rate, regular rhythm and normal heart sounds.   No murmur heard.  No edema. Pulmonary/Chest: Effort normal and breath sounds normal. No respiratory distress. She has no wheezes. She has no rales.  Breast: deferred   Abdominal: Soft. She exhibits no distension. There is no tenderness.  Lymphadenopathy: She has no cervical adenopathy.  Skin: Skin is warm and dry. She is not diaphoretic.  Psychiatric: She has a normal mood and affect. Her behavior is normal.        Assessment & Plan:   Physical exam: Screening blood work ordered Immunizations  Up to date  Gyn  Up to date  Exercise  HIIT Weight  Working on weight loss Skin   No concerns Substance abuse  none   See Problem List for Assessment and Plan of chronic medical problems.   Follow-up in 1 year, sooner if needed

## 2018-11-03 ENCOUNTER — Other Ambulatory Visit: Payer: Self-pay

## 2018-11-03 ENCOUNTER — Encounter: Payer: Self-pay | Admitting: Internal Medicine

## 2018-11-03 ENCOUNTER — Other Ambulatory Visit (INDEPENDENT_AMBULATORY_CARE_PROVIDER_SITE_OTHER): Payer: 59

## 2018-11-03 ENCOUNTER — Ambulatory Visit (INDEPENDENT_AMBULATORY_CARE_PROVIDER_SITE_OTHER): Payer: 59 | Admitting: Internal Medicine

## 2018-11-03 VITALS — BP 100/62 | HR 66 | Temp 98.7°F | Resp 14 | Ht 62.0 in | Wt 202.0 lb

## 2018-11-03 DIAGNOSIS — Z0000 Encounter for general adult medical examination without abnormal findings: Secondary | ICD-10-CM | POA: Diagnosis not present

## 2018-11-03 DIAGNOSIS — D509 Iron deficiency anemia, unspecified: Secondary | ICD-10-CM

## 2018-11-03 DIAGNOSIS — N83202 Unspecified ovarian cyst, left side: Secondary | ICD-10-CM

## 2018-11-03 DIAGNOSIS — M25561 Pain in right knee: Secondary | ICD-10-CM

## 2018-11-03 DIAGNOSIS — M25562 Pain in left knee: Secondary | ICD-10-CM

## 2018-11-03 DIAGNOSIS — N83201 Unspecified ovarian cyst, right side: Secondary | ICD-10-CM | POA: Diagnosis not present

## 2018-11-03 DIAGNOSIS — Z833 Family history of diabetes mellitus: Secondary | ICD-10-CM | POA: Diagnosis not present

## 2018-11-03 DIAGNOSIS — L309 Dermatitis, unspecified: Secondary | ICD-10-CM

## 2018-11-03 DIAGNOSIS — G8929 Other chronic pain: Secondary | ICD-10-CM

## 2018-11-03 LAB — COMPREHENSIVE METABOLIC PANEL
ALT: 7 U/L (ref 0–35)
AST: 14 U/L (ref 0–37)
Albumin: 4.3 g/dL (ref 3.5–5.2)
Alkaline Phosphatase: 47 U/L (ref 39–117)
BUN: 14 mg/dL (ref 6–23)
CO2: 27 mEq/L (ref 19–32)
Calcium: 9.3 mg/dL (ref 8.4–10.5)
Chloride: 103 mEq/L (ref 96–112)
Creatinine, Ser: 1.06 mg/dL (ref 0.40–1.20)
GFR: 77.78 mL/min (ref >60.00)
Glucose, Bld: 101 mg/dL — ABNORMAL HIGH (ref 70–99)
Potassium: 4.1 mEq/L (ref 3.5–5.1)
Sodium: 137 mEq/L (ref 135–145)
Total Bilirubin: 0.3 mg/dL (ref 0.2–1.2)
Total Protein: 7.9 g/dL (ref 6.0–8.3)

## 2018-11-03 LAB — CBC WITH DIFFERENTIAL/PLATELET
Basophils Absolute: 0 10*3/uL (ref 0.0–0.1)
Basophils Relative: 0.9 % (ref 0.0–3.0)
Eosinophils Absolute: 0.2 10*3/uL (ref 0.0–0.7)
Eosinophils Relative: 4.7 % (ref 0.0–5.0)
HCT: 37.1 % (ref 36.0–46.0)
Hemoglobin: 12.6 g/dL (ref 12.0–15.0)
Lymphocytes Relative: 34.2 % (ref 12.0–46.0)
Lymphs Abs: 1.5 10*3/uL (ref 0.7–4.0)
MCHC: 33.9 g/dL (ref 30.0–36.0)
MCV: 93.8 fl (ref 78.0–100.0)
Monocytes Absolute: 0.5 10*3/uL (ref 0.1–1.0)
Monocytes Relative: 10.5 % (ref 3.0–12.0)
Neutro Abs: 2.2 10*3/uL (ref 1.4–7.7)
Neutrophils Relative %: 49.7 % (ref 43.0–77.0)
Platelets: 222 10*3/uL (ref 150.0–400.0)
RBC: 3.95 Mil/uL (ref 3.87–5.11)
RDW: 12.9 % (ref 11.5–15.5)
WBC: 4.4 10*3/uL (ref 4.0–10.5)

## 2018-11-03 LAB — LIPID PANEL
Cholesterol: 126 mg/dL (ref 0–200)
HDL: 43.7 mg/dL (ref >39.00)
LDL Cholesterol: 70 mg/dL (ref 0–99)
NonHDL: 81.88
Total CHOL/HDL Ratio: 3
Triglycerides: 59 mg/dL (ref 0.0–149.0)
VLDL: 11.8 mg/dL (ref 0.0–40.0)

## 2018-11-03 LAB — HEMOGLOBIN A1C: Hgb A1c MFr Bld: 5.6 % (ref 4.6–6.5)

## 2018-11-03 LAB — TSH: TSH: 2.53 u[IU]/mL (ref 0.35–4.50)

## 2018-11-03 NOTE — Assessment & Plan Note (Signed)
Controlled, currently no rash Has rash intermittently

## 2018-11-03 NOTE — Assessment & Plan Note (Signed)
Chronic bilateral knee pain Intermittent cracking associated with pain Discussed orthopedic referral-she will let me know when she wants to be referred Any regular exercise and weight loss efforts

## 2018-11-03 NOTE — Assessment & Plan Note (Signed)
Check A1c. 

## 2018-11-03 NOTE — Assessment & Plan Note (Signed)
Following with GYN.  May have endometriosis Currently on birth control

## 2018-11-03 NOTE — Patient Instructions (Signed)
Tests ordered today. Your results will be released to MyChart (or called to you) after review.  If any changes need to be made, you will be notified at that same time.  All other Health Maintenance issues reviewed.   All recommended immunizations and age-appropriate screenings are up-to-date or discussed.  No immunization administered today.   Medications reviewed and updated.  Changes include :   none   Please followup in 1 year   Health Maintenance, Female Adopting a healthy lifestyle and getting preventive care are important in promoting health and wellness. Ask your health care provider about:  The right schedule for you to have regular tests and exams.  Things you can do on your own to prevent diseases and keep yourself healthy. What should I know about diet, weight, and exercise? Eat a healthy diet   Eat a diet that includes plenty of vegetables, fruits, low-fat dairy products, and lean protein.  Do not eat a lot of foods that are high in solid fats, added sugars, or sodium. Maintain a healthy weight Body mass index (BMI) is used to identify weight problems. It estimates body fat based on height and weight. Your health care provider can help determine your BMI and help you achieve or maintain a healthy weight. Get regular exercise Get regular exercise. This is one of the most important things you can do for your health. Most adults should:  Exercise for at least 150 minutes each week. The exercise should increase your heart rate and make you sweat (moderate-intensity exercise).  Do strengthening exercises at least twice a week. This is in addition to the moderate-intensity exercise.  Spend less time sitting. Even light physical activity can be beneficial. Watch cholesterol and blood lipids Have your blood tested for lipids and cholesterol at 23 years of age, then have this test every 5 years. Have your cholesterol levels checked more often if:  Your lipid or cholesterol  levels are high.  You are older than 23 years of age.  You are at high risk for heart disease. What should I know about cancer screening? Depending on your health history and family history, you may need to have cancer screening at various ages. This may include screening for:  Breast cancer.  Cervical cancer.  Colorectal cancer.  Skin cancer.  Lung cancer. What should I know about heart disease, diabetes, and high blood pressure? Blood pressure and heart disease  High blood pressure causes heart disease and increases the risk of stroke. This is more likely to develop in people who have high blood pressure readings, are of African descent, or are overweight.  Have your blood pressure checked: ? Every 3-5 years if you are 18-39 years of age. ? Every year if you are 40 years old or older. Diabetes Have regular diabetes screenings. This checks your fasting blood sugar level. Have the screening done:  Once every three years after age 40 if you are at a normal weight and have a low risk for diabetes.  More often and at a younger age if you are overweight or have a high risk for diabetes. What should I know about preventing infection? Hepatitis B If you have a higher risk for hepatitis B, you should be screened for this virus. Talk with your health care provider to find out if you are at risk for hepatitis B infection. Hepatitis C Testing is recommended for:  Everyone born from 1945 through 1965.  Anyone with known risk factors for hepatitis C. Sexually transmitted   infections (STIs)  Get screened for STIs, including gonorrhea and chlamydia, if: ? You are sexually active and are younger than 24 years of age. ? You are older than 24 years of age and your health care provider tells you that you are at risk for this type of infection. ? Your sexual activity has changed since you were last screened, and you are at increased risk for chlamydia or gonorrhea. Ask your health care  provider if you are at risk.  Ask your health care provider about whether you are at high risk for HIV. Your health care provider may recommend a prescription medicine to help prevent HIV infection. If you choose to take medicine to prevent HIV, you should first get tested for HIV. You should then be tested every 3 months for as long as you are taking the medicine. Pregnancy  If you are about to stop having your period (premenopausal) and you may become pregnant, seek counseling before you get pregnant.  Take 400 to 800 micrograms (mcg) of folic acid every day if you become pregnant.  Ask for birth control (contraception) if you want to prevent pregnancy. Osteoporosis and menopause Osteoporosis is a disease in which the bones lose minerals and strength with aging. This can result in bone fractures. If you are 65 years old or older, or if you are at risk for osteoporosis and fractures, ask your health care provider if you should:  Be screened for bone loss.  Take a calcium or vitamin D supplement to lower your risk of fractures.  Be given hormone replacement therapy (HRT) to treat symptoms of menopause. Follow these instructions at home: Lifestyle  Do not use any products that contain nicotine or tobacco, such as cigarettes, e-cigarettes, and chewing tobacco. If you need help quitting, ask your health care provider.  Do not use street drugs.  Do not share needles.  Ask your health care provider for help if you need support or information about quitting drugs. Alcohol use  Do not drink alcohol if: ? Your health care provider tells you not to drink. ? You are pregnant, may be pregnant, or are planning to become pregnant.  If you drink alcohol: ? Limit how much you use to 0-1 drink a day. ? Limit intake if you are breastfeeding.  Be aware of how much alcohol is in your drink. In the [place] one drink equals one 12 oz bottle of beer (355 mL), one 5 oz glass of wine (148 mL), or one 1  oz glass of hard liquor (44 mL). General instructions  Schedule regular health, dental, and eye exams.  Stay current with your vaccines.  Tell your health care provider if: ? You often feel depressed. ? You have ever been abused or do not feel safe at home. Summary  Adopting a healthy lifestyle and getting preventive care are important in promoting health and wellness.  Follow your health care provider's instructions about healthy diet, exercising, and getting tested or screened for diseases.  Follow your health care provider's instructions on monitoring your cholesterol and blood pressure. This information is not intended to replace advice given to you by your health care provider. Make sure you discuss any questions you have with your health care provider. Document Released: 10/08/2010 Document Revised: 03/18/2018 Document Reviewed: 03/18/2018 Elsevier Patient Education  2020 [employer]  

## 2018-11-04 ENCOUNTER — Encounter: Payer: Self-pay | Admitting: Internal Medicine

## 2018-11-04 LAB — IRON,TIBC AND FERRITIN PANEL
%SAT: 19 % (calc) (ref 16–45)
Ferritin: 25 ng/mL (ref 16–154)
Iron: 61 ug/dL (ref 40–190)
TIBC: 317 mcg/dL (calc) (ref 250–450)

## 2019-02-01 ENCOUNTER — Encounter: Payer: Self-pay | Admitting: Internal Medicine

## 2019-06-14 NOTE — Progress Notes (Signed)
Virtual Visit via Video Note  I connected with Laurie Ware on 06/15/19 at 10:30 AM EST by a video enabled telemedicine application and verified that I am speaking with the correct person using two identifiers.   I discussed the limitations of evaluation and management by telemedicine and the availability of in person appointments. The patient expressed understanding and agreed to proceed.  Present for the visit:  Myself, Dr Cheryll Cockayne, Darletta Moll.  The patient is currently at home and I am in the office.    No referring provider.    History of Present Illness: This is an acute visit for COVID.  She was diagnosed 06/09/2019 at home.  Her symptoms started 06/04/19.  She initially had a runny nose and had a fever.  She lost her sense of smell Tuesday night.  The past couple of days she has had drainage in her throat and is waking up choking.  She feels SOB with coming up stairs and talking.  She has a dry cough and a little wheezing.  She often has wheezing because of her asthma so does not think this is much different.  She has had some dizziness and headaches.  She called yesterday to set up the appointment because she felt much worse and she was concerned.  She took nyquil last night.   She was able to sleep well and feels better today.   Review of Systems  Constitutional: Positive for malaise/fatigue. Negative for fever.  HENT: Negative for sore throat.        No sense of smell,  PND  Respiratory: Positive for cough (dry), shortness of breath (stairs) and wheezing ( a little, but often wheeze normally due to asthma).   Cardiovascular: Negative for chest pain.  Gastrointestinal: Positive for abdominal pain, constipation and nausea (2 days ago). Negative for diarrhea.  Musculoskeletal: Negative for back pain and myalgias.  Neurological: Positive for dizziness and headaches.      Social History   Socioeconomic History  . Marital status: Single    Spouse name: Not on file   . Number of children: Not on file  . Years of education: Not on file  . Highest education level: Not on file  Occupational History  . Not on file  Tobacco Use  . Smoking status: Never Smoker  . Smokeless tobacco: Never Used  Substance and Sexual Activity  . Alcohol use: Yes    Comment: social  . Drug use: No  . Sexual activity: Yes    Partners: Male    Birth control/protection: Condom, Inserts  Other Topics Concern  . Not on file  Social History Narrative  . Not on file   Social Determinants of Health   Financial Resource Strain:   . Difficulty of Paying Living Expenses: Not on file  Food Insecurity:   . Worried About Programme researcher, broadcasting/film/video in the Last Year: Not on file  . Ran Out of Food in the Last Year: Not on file  Transportation Needs:   . Lack of Transportation (Medical): Not on file  . Lack of Transportation (Non-Medical): Not on file  Physical Activity:   . Days of Exercise per Week: Not on file  . Minutes of Exercise per Session: Not on file  Stress:   . Feeling of Stress : Not on file  Social Connections:   . Frequency of Communication with Friends and Family: Not on file  . Frequency of Social Gatherings with Friends and Family: Not on file  .  Attends Religious Services: Not on file  . Active Member of Clubs or Organizations: Not on file  . Attends Archivist Meetings: Not on file  . Marital Status: Not on file     Observations/Objective: Appears well in NAD Breathing normally, speaking in full sentences Skin appears warm and dry  Assessment and Plan:  See Problem List for Assessment and Plan of chronic medical problems.   Follow Up Instructions:    I discussed the assessment and treatment plan with the patient. The patient was provided an opportunity to ask questions and all were answered. The patient agreed with the plan and demonstrated an understanding of the instructions.   The patient was advised to call back or seek an in-person  evaluation if the symptoms worsen or if the condition fails to improve as anticipated.    Binnie Rail, MD

## 2019-06-15 ENCOUNTER — Encounter: Payer: Self-pay | Admitting: Internal Medicine

## 2019-06-15 ENCOUNTER — Ambulatory Visit (INDEPENDENT_AMBULATORY_CARE_PROVIDER_SITE_OTHER): Payer: 59 | Admitting: Internal Medicine

## 2019-06-15 DIAGNOSIS — U071 COVID-19: Secondary | ICD-10-CM | POA: Insufficient documentation

## 2019-06-15 NOTE — Assessment & Plan Note (Signed)
Symptoms started 06/04/2019.  Officially diagnosed 06/09/2019 Symptoms overall mild and feels better today than yesterday Symptoms ongoing for more than 10 days Discussed the importance of symptomatic treatment She deferred trying an inhaler Shortness of breath is intermittent with exertion and clinically I do not think she has pneumonia Continue symptomatic treatment Advised her to call if her symptoms change, worsen or if she has any questions

## 2019-06-21 ENCOUNTER — Telehealth: Payer: Self-pay

## 2019-06-21 NOTE — Telephone Encounter (Signed)
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    COVID + 3.3.21

## 2019-06-23 NOTE — Telephone Encounter (Signed)
Noted  

## 2019-10-07 ENCOUNTER — Telehealth: Payer: Self-pay | Admitting: Internal Medicine

## 2019-10-07 NOTE — Telephone Encounter (Signed)
New message:   Pt is asking if we can send her immunization records by email to her [email or web address] Please advise.

## 2019-10-12 NOTE — Telephone Encounter (Signed)
Emailed today

## 2020-02-28 NOTE — Patient Instructions (Addendum)
Blood work was ordered.    Flu immunization administered today.    Medications changes include :   none   A referral was ordered for sports medicine.   Someone will call you to schedule an appointment.    Please followup in 1 year    Health Maintenance, Female Adopting a healthy lifestyle and getting preventive care are important in promoting health and wellness. Ask your health care provider about:  The right schedule for you to have regular tests and exams.  Things you can do on your own to prevent diseases and keep yourself healthy. What should I know about diet, weight, and exercise? Eat a healthy diet   Eat a diet that includes plenty of vegetables, fruits, low-fat dairy products, and lean protein.  Do not eat a lot of foods that are high in solid fats, added sugars, or sodium. Maintain a healthy weight Body mass index (BMI) is used to identify weight problems. It estimates body fat based on height and weight. Your health care provider can help determine your BMI and help you achieve or maintain a healthy weight. Get regular exercise Get regular exercise. This is one of the most important things you can do for your health. Most adults should:  Exercise for at least 150 minutes each week. The exercise should increase your heart rate and make you sweat (moderate-intensity exercise).  Do strengthening exercises at least twice a week. This is in addition to the moderate-intensity exercise.  Spend less time sitting. Even light physical activity can be beneficial. Watch cholesterol and blood lipids Have your blood tested for lipids and cholesterol at 24 years of age, then have this test every 5 years. Have your cholesterol levels checked more often if:  Your lipid or cholesterol levels are high.  You are older than 24 years of age.  You are at high risk for heart disease. What should I know about cancer screening? Depending on your health history and family history,  you may need to have cancer screening at various ages. This may include screening for:  Breast cancer.  Cervical cancer.  Colorectal cancer.  Skin cancer.  Lung cancer. What should I know about heart disease, diabetes, and high blood pressure? Blood pressure and heart disease  High blood pressure causes heart disease and increases the risk of stroke. This is more likely to develop in people who have high blood pressure readings, are of African descent, or are overweight.  Have your blood pressure checked: ? Every 3-5 years if you are 2-29 years of age. ? Every year if you are 7 years old or older. Diabetes Have regular diabetes screenings. This checks your fasting blood sugar level. Have the screening done:  Once every three years after age 36 if you are at a normal weight and have a low risk for diabetes.  More often and at a younger age if you are overweight or have a high risk for diabetes. What should I know about preventing infection? Hepatitis B If you have a higher risk for hepatitis B, you should be screened for this virus. Talk with your health care provider to find out if you are at risk for hepatitis B infection. Hepatitis C Testing is recommended for:  Everyone born from [birth year] through 1965.  Anyone with known risk factors for hepatitis C. Sexually transmitted infections (STIs)  Get screened for STIs, including gonorrhea and chlamydia, if: ? You are sexually active and are younger than 24 years of age. ? You  are older than 24 years of age and your health care provider tells you that you are at risk for this type of infection. ? Your sexual activity has changed since you were last screened, and you are at increased risk for chlamydia or gonorrhea. Ask your health care provider if you are at risk.  Ask your health care provider about whether you are at high risk for HIV. Your health care provider may recommend a prescription medicine to help prevent HIV infection.  If you choose to take medicine to prevent HIV, you should first get tested for HIV. You should then be tested every 3 months for as long as you are taking the medicine. Pregnancy  If you are about to stop having your period (premenopausal) and you may become pregnant, seek counseling before you get pregnant.  Take 400 to 800 micrograms (mcg) of folic acid every day if you become pregnant.  Ask for birth control (contraception) if you want to prevent pregnancy. Osteoporosis and menopause Osteoporosis is a disease in which the bones lose minerals and strength with aging. This can result in bone fractures. If you are 13 years old or older, or if you are at risk for osteoporosis and fractures, ask your health care provider if you should:  Be screened for bone loss.  Take a calcium or vitamin D supplement to lower your risk of fractures.  Be given hormone replacement therapy (HRT) to treat symptoms of menopause. Follow these instructions at home: Lifestyle  Do not use any products that contain nicotine or tobacco, such as cigarettes, e-cigarettes, and chewing tobacco. If you need help quitting, ask your health care provider.  Do not use street drugs.  Do not share needles.  Ask your health care provider for help if you need support or information about quitting drugs. Alcohol use  Do not drink alcohol if: ? Your health care provider tells you not to drink. ? You are pregnant, may be pregnant, or are planning to become pregnant.  If you drink alcohol: ? Limit how much you use to 0-1 drink a day. ? Limit intake if you are breastfeeding.  Be aware of how much alcohol is in your drink. In the [place] one drink equals one 12 oz bottle of beer (355 mL), one 5 oz glass of wine (148 mL), or one 1 oz glass of hard liquor (44 mL). General instructions  Schedule regular health, dental, and eye exams.  Stay current with your vaccines.  Tell your health care provider if: ? You often feel  depressed. ? You have ever been abused or do not feel safe at home. Summary  Adopting a healthy lifestyle and getting preventive care are important in promoting health and wellness.  Follow your health care provider's instructions about healthy diet, exercising, and getting tested or screened for diseases.  Follow your health care provider's instructions on monitoring your cholesterol and blood pressure. This information is not intended to replace advice given to you by your health care provider. Make sure you discuss any questions you have with your health care provider. Document Revised: 03/18/2018 Document Reviewed: 03/18/2018 Elsevier Patient Education  2020 [employer].

## 2020-02-28 NOTE — Progress Notes (Signed)
Subjective:    Patient ID: Laurie Ware, female    DOB: 1995-05-23, 24 y.o.   MRN: [id number]   This visit occurred during the SARS-CoV-2 public health emergency.  Safety protocols were in place, including screening questions prior to the visit, additional usage of staff PPE, and extensive cleaning of exam room while observing appropriate contact time as indicated for disinfecting solutions.    HPI She is here for a physical exam.   She is now a Air traffic controller - teaches science.  She coughs or regurgitates little pellets like things - ? From stomach, chest or throat.  It is yellowish, small and can come at two at a time.  She will be talking and it will just happen.  It has been going on for one year or so.  It is not vomit.  It does not hurt.    She does have some increased stress because she still trying to figure out what she wants to do with her life.  She has thought about going to PA school, but logistically that is not easy.  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  Medications and allergies reviewed with patient and updated if appropriate.  Patient Active Problem List   Diagnosis Date Noted  . COVID-19 06/15/2019  . Family history of diabetes mellitus in mother 11/03/2018  . Bilateral ovarian cysts 11/03/2018  . Eczema 11/03/2018  . Knee pain, bilateral 11/03/2018    No current outpatient medications on file prior to visit.   No current facility-administered medications on file prior to visit.    Past Medical History:  Diagnosis Date  . Asthma     Past Surgical History:  Procedure Laterality Date  . WISDOM TOOTH EXTRACTION      Social History   Socioeconomic History  . Marital status: Single    Spouse name: Not on file  . Number of children: Not on file  . Years of education: Not on file  . Highest education level: Not on file  Occupational History  . Not on file  Tobacco Use  . Smoking status: Never Smoker  . Smokeless  tobacco: Never Used  Substance and Sexual Activity  . Alcohol use: Yes    Comment: social  . Drug use: No  . Sexual activity: Yes    Partners: Male    Birth control/protection: Condom, Inserts  Other Topics Concern  . Not on file  Social History Narrative  . Not on file   Social Determinants of Health   Financial Resource Strain:   . Difficulty of Paying Living Expenses: Not on file  Food Insecurity:   . Worried About Programme researcher, broadcasting/film/video in the Last Year: Not on file  . Ran Out of Food in the Last Year: Not on file  Transportation Needs:   . Lack of Transportation (Medical): Not on file  . Lack of Transportation (Non-Medical): Not on file  Physical Activity:   . Days of Exercise per Week: Not on file  . Minutes of Exercise per Session: Not on file  Stress:   . Feeling of Stress : Not on file  Social Connections:   . Frequency of Communication with Friends and Family: Not on file  . Frequency of Social Gatherings with Friends and Family: Not on file  . Attends Religious Services: Not on file  . Active Member of Clubs or Organizations: Not on file  . Attends Banker Meetings: Not  on file  . Marital Status: Not on file    Family History  Problem Relation Age of Onset  . Hypertension Father   . Diabetes Maternal Grandmother   . Breast cancer Paternal Grandmother   . Diabetes Paternal Grandmother   . Diabetes Mother     Review of Systems  Constitutional: Negative for chills and fever.  HENT: Positive for postnasal drip. Negative for sore throat and voice change.   Eyes: Negative for visual disturbance.  Respiratory: Positive for choking (sometimes wakes up choking). Negative for cough, shortness of breath and wheezing.        Snores a little, but not often  Cardiovascular: Negative for chest pain, palpitations and leg swelling.  Gastrointestinal: Positive for nausea (around menses). Negative for abdominal pain, blood in stool, constipation and diarrhea.        GERD 1-2 /month  Genitourinary: Negative for dysuria and hematuria.  Musculoskeletal: Positive for arthralgias (hips and knees pop and hurt sometimes, shoulder). Negative for back pain.  Skin: Positive for rash (eczema). Negative for color change.  Neurological: Positive for headaches (occ). Negative for light-headedness.  Psychiatric/Behavioral: Negative for dysphoric mood. The patient is nervous/anxious (mild).        Objective:   Vitals:   02/29/20 1500  BP: 106/72  Pulse: 73  Temp: 98.1 F (36.7 C)  SpO2: 99%   Filed Weights   02/29/20 1500  Weight: 204 lb (92.5 kg)   Body mass index is 37.31 kg/m.  BP Readings from Last 3 Encounters:  02/29/20 106/72  11/03/18 100/62  03/20/18 109/66    Wt Readings from Last 3 Encounters:  02/29/20 204 lb (92.5 kg)  11/03/18 202 lb (91.6 kg)  01/16/16 186 lb (84.4 kg)     Physical Exam Constitutional: She appears well-developed and well-nourished. No distress.  HENT:  Head: Normocephalic and atraumatic.  Right Ear: External ear normal. Normal ear canal and TM Left Ear: External ear normal.  Normal ear canal and TM Mouth/Throat: Oropharynx is clear and moist.  Eyes: Conjunctivae and EOM are normal.  Neck: Neck supple. No tracheal deviation present. No thyromegaly present.  No carotid bruit  Cardiovascular: Normal rate, regular rhythm and normal heart sounds.   No murmur heard.  No edema. Pulmonary/Chest: Effort normal and breath sounds normal. No respiratory distress. She has no wheezes. She has no rales.  Breast: deferred   Abdominal: Soft. She exhibits no distension. There is no tenderness.  Lymphadenopathy: She has no cervical adenopathy.  Skin: Skin is warm and dry. She is not diaphoretic.  Psychiatric: She has a normal mood and affect. Her behavior is normal.        Assessment & Plan:   Physical exam: Screening blood work    ordered Immunizations   Flu vaccine today, others up to date Gyn   - Dr Renaldo Fiddler -  Up to date Exercise  - not recently-stressed the importance of regular exercise Weight  Has gained weight - advised weight loss Substance abuse  none      See Problem List for Assessment and Plan of chronic medical problems.

## 2020-02-29 ENCOUNTER — Other Ambulatory Visit: Payer: Self-pay

## 2020-02-29 ENCOUNTER — Encounter: Payer: Self-pay | Admitting: Internal Medicine

## 2020-02-29 ENCOUNTER — Ambulatory Visit (INDEPENDENT_AMBULATORY_CARE_PROVIDER_SITE_OTHER): Payer: 59 | Admitting: Internal Medicine

## 2020-02-29 VITALS — BP 106/72 | HR 73 | Temp 98.1°F | Ht 62.0 in | Wt 204.0 lb

## 2020-02-29 DIAGNOSIS — Z833 Family history of diabetes mellitus: Secondary | ICD-10-CM

## 2020-02-29 DIAGNOSIS — Z1159 Encounter for screening for other viral diseases: Secondary | ICD-10-CM

## 2020-02-29 DIAGNOSIS — Z23 Encounter for immunization: Secondary | ICD-10-CM | POA: Diagnosis not present

## 2020-02-29 DIAGNOSIS — Z0000 Encounter for general adult medical examination without abnormal findings: Secondary | ICD-10-CM

## 2020-02-29 DIAGNOSIS — M25562 Pain in left knee: Secondary | ICD-10-CM

## 2020-02-29 DIAGNOSIS — M25561 Pain in right knee: Secondary | ICD-10-CM | POA: Diagnosis not present

## 2020-02-29 DIAGNOSIS — G8929 Other chronic pain: Secondary | ICD-10-CM

## 2020-02-29 LAB — COMPREHENSIVE METABOLIC PANEL
ALT: 10 U/L (ref 0–35)
AST: 15 U/L (ref 0–37)
Albumin: 4.4 g/dL (ref 3.5–5.2)
Alkaline Phosphatase: 46 U/L (ref 39–117)
BUN: 17 mg/dL (ref 6–23)
CO2: 29 mEq/L (ref 19–32)
Calcium: 9.4 mg/dL (ref 8.4–10.5)
Chloride: 103 mEq/L (ref 96–112)
Creatinine, Ser: 0.96 mg/dL (ref 0.40–1.20)
GFR: 82.96 mL/min (ref >60.00)
Glucose, Bld: 90 mg/dL (ref 70–99)
Potassium: 3.8 mEq/L (ref 3.5–5.1)
Sodium: 139 mEq/L (ref 135–145)
Total Bilirubin: 0.4 mg/dL (ref 0.2–1.2)
Total Protein: 8 g/dL (ref 6.0–8.3)

## 2020-02-29 LAB — CBC WITH DIFFERENTIAL/PLATELET
Basophils Absolute: 0 10*3/uL (ref 0.0–0.1)
Basophils Relative: 0.8 % (ref 0.0–3.0)
Eosinophils Absolute: 0.2 10*3/uL (ref 0.0–0.7)
Eosinophils Relative: 3.5 % (ref 0.0–5.0)
HCT: 34 % — ABNORMAL LOW (ref 36.0–46.0)
Hemoglobin: 11.6 g/dL — ABNORMAL LOW (ref 12.0–15.0)
Lymphocytes Relative: 40.9 % (ref 12.0–46.0)
Lymphs Abs: 2.3 10*3/uL (ref 0.7–4.0)
MCHC: 34.1 g/dL (ref 30.0–36.0)
MCV: 92.2 fl (ref 78.0–100.0)
Monocytes Absolute: 0.6 10*3/uL (ref 0.1–1.0)
Monocytes Relative: 10 % (ref 3.0–12.0)
Neutro Abs: 2.5 10*3/uL (ref 1.4–7.7)
Neutrophils Relative %: 44.8 % (ref 43.0–77.0)
Platelets: 208 10*3/uL (ref 150.0–400.0)
RBC: 3.69 Mil/uL — ABNORMAL LOW (ref 3.87–5.11)
RDW: 12.6 % (ref 11.5–15.5)
WBC: 5.5 10*3/uL (ref 4.0–10.5)

## 2020-02-29 LAB — LIPID PANEL
Cholesterol: 137 mg/dL (ref 0–200)
HDL: 50.9 mg/dL (ref >39.00)
LDL Cholesterol: 71 mg/dL (ref 0–99)
NonHDL: 86.47
Total CHOL/HDL Ratio: 3
Triglycerides: 78 mg/dL (ref 0.0–149.0)
VLDL: 15.6 mg/dL (ref 0.0–40.0)

## 2020-02-29 LAB — TSH: TSH: 1.24 u[IU]/mL (ref 0.35–4.50)

## 2020-02-29 LAB — HEMOGLOBIN A1C: Hgb A1c MFr Bld: 5.4 % (ref 4.6–6.5)

## 2020-02-29 NOTE — Assessment & Plan Note (Signed)
Chronic Has had popping and some pain in her knees for a long time-never evaluated Will refer to sports medicine Advise regular exercise, strengthening exercises for her legs and weight loss

## 2020-02-29 NOTE — Assessment & Plan Note (Signed)
Chronic At risk for diabetes Stressed regular exercise, weight loss and healthy diet Check A1c

## 2020-02-29 NOTE — Progress Notes (Deleted)
   Subjective:    I'm seeing this patient as a consultation for:  Dr. Lawerance Bach. Note will be routed back to referring provider/PCP.  CC: B knee pain  I, Bocephus Cali, LAT, ATC, am serving as scribe for Dr. Clementeen Graham.  HPI: Pt is a 24 y/o female presenting w/ c/o B knee pain.  She locates her pain to .  Knee swelling: Knee mechanical symptoms: Aggravating factors: Treatments tried:  Past medical history, Surgical history, Family history, Social history, Allergies, and medications have been entered into the medical record, reviewed. ***  Review of Systems: No new headache, visual changes, nausea, vomiting, diarrhea, constipation, dizziness, abdominal pain, skin rash, fevers, chills, night sweats, weight loss, swollen lymph nodes, body aches, joint swelling, muscle aches, chest pain, shortness of breath, mood changes, visual or auditory hallucinations.   Objective:   There were no vitals filed for this visit. General: Well Developed, well nourished, and in no acute distress.  Neuro/Psych: Alert and oriented x3, extra-ocular muscles intact, able to move all 4 extremities, sensation grossly intact. Skin: Warm and dry, no rashes noted.  Respiratory: Not using accessory muscles, speaking in full sentences, trachea midline.  Cardiovascular: Pulses palpable, no extremity edema. Abdomen: Does not appear distended. MSK: ***  Lab and Radiology Results No results found for this or any previous visit (from the past 72 hour(s)). No results found.  Impression and Recommendations:    Assessment and Plan: 24 y.o. female with ***.  PDMP not reviewed this encounter. No orders of the defined types were placed in this encounter.  No orders of the defined types were placed in this encounter.   Discussed warning signs or symptoms. Please see discharge instructions. Patient expresses understanding.   ***

## 2020-03-01 ENCOUNTER — Ambulatory Visit: Payer: 59 | Admitting: Family Medicine

## 2020-03-01 NOTE — Addendum Note (Signed)
Addended by: Karma Ganja on: 03/01/2020 01:18 PM   Modules accepted: Orders

## 2020-03-03 LAB — HEPATITIS C ANTIBODY
Hepatitis C Ab: NONREACTIVE
SIGNAL TO CUT-OFF: 0.01 (ref <1.00)

## 2020-04-14 ENCOUNTER — Emergency Department (HOSPITAL_COMMUNITY)
Admission: EM | Admit: 2020-04-14 | Discharge: 2020-04-14 | Disposition: A | Discharge status: Home / Self Care | Payer: 59 | Attending: Emergency Medicine | Admitting: Emergency Medicine

## 2020-04-14 ENCOUNTER — Emergency Department (HOSPITAL_COMMUNITY): Payer: 59

## 2020-04-14 ENCOUNTER — Other Ambulatory Visit: Payer: Self-pay

## 2020-04-14 DIAGNOSIS — R112 Nausea with vomiting, unspecified: Secondary | ICD-10-CM | POA: Insufficient documentation

## 2020-04-14 DIAGNOSIS — R55 Syncope and collapse: Secondary | ICD-10-CM | POA: Diagnosis present

## 2020-04-14 DIAGNOSIS — Z5321 Procedure and treatment not carried out due to patient leaving prior to being seen by health care provider: Secondary | ICD-10-CM | POA: Insufficient documentation

## 2020-04-14 LAB — I-STAT BETA HCG BLOOD, ED (MC, WL, AP ONLY): I-stat hCG, quantitative: <5 m[IU]/mL (ref <5)

## 2020-04-14 LAB — CBC
HCT: 34.6 % — ABNORMAL LOW (ref 36.0–46.0)
Hemoglobin: 11.2 g/dL — ABNORMAL LOW (ref 12.0–15.0)
MCH: 30.9 pg (ref 26.0–34.0)
MCHC: 32.4 g/dL (ref 30.0–36.0)
MCV: 95.6 fL (ref 80.0–100.0)
Platelets: 212 10*3/uL (ref 150–400)
RBC: 3.62 MIL/uL — ABNORMAL LOW (ref 3.87–5.11)
RDW: 11.9 % (ref 11.5–15.5)
WBC: 5 10*3/uL (ref 4.0–10.5)
nRBC: 0 % (ref 0.0–0.2)

## 2020-04-14 LAB — URINALYSIS, ROUTINE W REFLEX MICROSCOPIC
Bilirubin Urine: NEGATIVE
Glucose, UA: NEGATIVE mg/dL
Hgb urine dipstick: NEGATIVE
Ketones, ur: NEGATIVE mg/dL
Leukocytes,Ua: NEGATIVE
Nitrite: NEGATIVE
Protein, ur: NEGATIVE mg/dL
Specific Gravity, Urine: 1.014 (ref 1.005–1.030)
pH: 5 (ref 5.0–8.0)

## 2020-04-14 LAB — BASIC METABOLIC PANEL
Anion gap: 11 (ref 5–15)
BUN: 12 mg/dL (ref 6–20)
CO2: 22 mmol/L (ref 22–32)
Calcium: 9.1 mg/dL (ref 8.9–10.3)
Chloride: 104 mmol/L (ref 98–111)
Creatinine, Ser: 0.94 mg/dL (ref 0.44–1.00)
GFR, Estimated: >60 mL/min (ref >60)
Glucose, Bld: 103 mg/dL — ABNORMAL HIGH (ref 70–99)
Potassium: 4 mmol/L (ref 3.5–5.1)
Sodium: 137 mmol/L (ref 135–145)

## 2020-04-14 NOTE — ED Notes (Signed)
Called pt name x3 for VS recheck. No response from pt.  

## 2020-04-14 NOTE — ED Triage Notes (Signed)
Pt here with reports of syncopal episode, unwitnessed. Pt then experienced N/V. Unable to recall event. VSS.

## 2020-04-14 NOTE — ED Notes (Addendum)
Called pt 3x for vitals..the pt did not respond.

## 2020-04-17 ENCOUNTER — Encounter: Payer: Self-pay | Admitting: Internal Medicine

## 2020-04-17 ENCOUNTER — Ambulatory Visit (INDEPENDENT_AMBULATORY_CARE_PROVIDER_SITE_OTHER): Payer: 59 | Admitting: Internal Medicine

## 2020-04-17 ENCOUNTER — Other Ambulatory Visit: Payer: Self-pay

## 2020-04-17 DIAGNOSIS — R55 Syncope and collapse: Secondary | ICD-10-CM | POA: Insufficient documentation

## 2020-04-17 DIAGNOSIS — S060X9A Concussion with loss of consciousness of unspecified duration, initial encounter: Secondary | ICD-10-CM | POA: Insufficient documentation

## 2020-04-17 DIAGNOSIS — S060XAA Concussion with loss of consciousness status unknown, initial encounter: Secondary | ICD-10-CM | POA: Insufficient documentation

## 2020-04-17 DIAGNOSIS — S060X1A Concussion with loss of consciousness of 30 minutes or less, initial encounter: Secondary | ICD-10-CM

## 2020-04-17 NOTE — Assessment & Plan Note (Signed)
Acute Occurred 1/7 - secondary to what sounds like vasovagal syncope W/u in ED reassuring  With headaches - no other symptoms Continue ibuprofen and tylenol as needed Rest, will keep out of work for the next two days Call if headaches do not improve/worsen or if she has any new symptoms

## 2020-04-17 NOTE — Patient Instructions (Signed)
Note given for work.   Take ibuprofen or tylenol as needed for your headache.  Continue to drink plenty of fluids every day.      Concussion, Adult  A concussion is a brain injury from a hard, direct hit (trauma) to your head or body. This direct hit causes your brain to quickly shake back and forth inside your skull. A concussion may also be called a mild traumatic brain injury (TBI). Healing from this injury can take time. What are the causes? This condition is caused by:  A direct hit to your head, such as: ? Running into a player during a game. ? Being hit in a fight. ? Hitting your head on a hard surface.  A quick and sudden movement of the head or neck, such as in a car crash. What are the signs or symptoms? The signs of a concussion can be hard to notice. They may be missed by you, family members, and doctors. You may look fine on the outside but may not act or feel normal. Physical symptoms  Headaches.  Being dizzy.  Problems with body balance.  Being sensitive to light or noise.  Vomiting or feeling like you may vomit.  Being tired.  Problems seeing or hearing.  Not sleeping or eating as you used to.  Seizure. Mental and emotional symptoms  Feeling grouchy (irritable).  Having mood changes.  Problems remembering things.  Trouble focusing your mind (concentrating), organizing, or making decisions.  Being slow to think, act, react, speak, or read.  Feeling worried or nervous (anxious).  Feeling sad (depressed). How is this treated? This condition may be treated by:  Stopping sports or activity if you are injured. If you hit your head or have signs of concussion: ? Do not return to sports or activities the same day. ? Get checked by a doctor before you return to your activities.  Resting your body and your mind.  Being watched carefully, often at home.  Medicines to help with symptoms such as: ? Headaches. ? Feeling like you may  vomit. ? Problems with sleep.  Avoiding alcohol and drugs.  Being asked to go to a concussion clinic or a place to help you recover (rehabilitation center). Recovery from a concussion can take time. Return to activities only:  When you are fully healed.  When your doctor says it is safe. Avoid taking strong pain medicines (opioids) for a concussion. Follow these instructions at home: Activity  Limit activities that need a lot of thought or focus, such as: ? Homework or work for your job. ? Watching TV. ? Using the computer or phone. ? Playing memory games and puzzles.  Rest. Rest helps your brain heal. Make sure you: ? Get plenty of sleep. Most adults should get 7-9 hours of sleep each night. ? Rest during the day. Take naps or breaks when you feel tired.  Avoid activity like exercise until your doctor says its safe. Stop any activity that makes symptoms worse.  Do not do activities that could cause a second concussion, such as riding a bike or playing sports.  Ask your doctor when you can return to your normal activities, such as school, work, sports, and driving. Your ability to react may be slower. Do not do these activities if you are dizzy. General instructions  Take over-the-counter and prescription medicines only as told by your doctor.  Do not drink alcohol until your doctor says you can.  Watch your symptoms and tell other people  to do the same. Other problems can occur after a concussion. Older adults have a higher risk of serious problems.  Tell your work Production designer, theatre/television/film, teachers, Tax adviser, school counselor, coach, or Event organiser about your injury and symptoms. Tell them about what you can or cannot do.  Keep all follow-up visits as told by your doctor. This is important.   How is this prevented? It is very important that you do not get another brain injury. In rare cases, another injury can cause brain damage that will not go away, brain swelling, or death. The  risk of this is greatest in the first 7-10 days after a head injury. To avoid injuries:  Stop activities that could lead to a second concussion, such as contact sports, until your doctor says it is okay.  When you return to sports or activities: ? Do not crash into other players. This is how most concussions happen. ? Follow the rules. ? Respect other players. Do not engage in violent behavior while playing.  Get regular exercise. Do strength and balance training.  Wear a helmet that fits you well during sports, biking, or other activities.  Helmets can help protect you from serious skull and brain injuries, but they do not protect you from a concussion. Even when wearing a helmet, you should avoid being hit in the head. Contact a doctor if:  Your symptoms do not get better.  You have new symptoms.  You have another injury. Get help right away if:  You have bad headaches or your headaches get worse.  You feel weak or numb in any part of your body.  You feel mixed up (confused).  Your balance gets worse.  You vomit often.  You feel more sleepy than normal.  You cannot speak well, or have slurred speech.  You have a seizure.  Others have trouble waking you up.  You have changes in how you act.  You have changes in how you see (vision).  You pass out (lose consciousness). These symptoms may be an emergency. Do not wait to see if the symptoms will go away. Get medical help right away. Call your local emergency services (911 in the [place] Do not drive yourself to the hospital. Summary  A concussion is a brain injury from a hard, direct hit (trauma) to your head or body.  This condition is treated with rest and careful watching of symptoms.  Ask your doctor when you can return to your normal activities, such as school, work, or driving.  Get help right away if you have a very bad headache, feel weak in any part of your body, have a seizure, have changes in how you act  or see, or if you are mixed up or more sleepy than normal. This information is not intended to replace advice given to you by your health care provider. Make sure you discuss any questions you have with your health care provider. Document Revised: 02/04/2019 Document Reviewed: 02/04/2019 Elsevier Patient Education  2021 [employer].

## 2020-04-17 NOTE — Assessment & Plan Note (Signed)
Acute Symptoms c/w vasovagal syncope Residuals HA's  - likely concussion Discussed ibuprofen or tylenol for headaches Rest, will keep her out of work today - Wednesday Keep up with good hydration, pump legs before standing discussed further evaluation - at this time unlikely cardiac or neurological cause, so will hold off on referral

## 2020-04-17 NOTE — Progress Notes (Signed)
Subjective:    Patient ID: Laurie Ware, female    DOB: 03-13-1996, 25 y.o.   MRN: [id number]  HPI The patient is here for follow up from the emergency room.  04/14/2020-went to ED for syncope.  She was at school.  She went to stand up and felt lightheaded.  She took a few steps.  She tried to sit down and she just remembers waking up at the floor.  She hit her head.  She walked to another classroom and she did vomit several times.  EMS was called. She was minimally orthostatic - SBP change by 8 from sitting to standing.  She drank and ate normally that day.    She has headaches in the left-posterior side of her head where she hit it.  She did not stay in the ED for evaluation because of the wait, but did have testing done.    BP 120/49, pulse 73, temp 99.9, respirations 16, SPO2 100%  UA-normal BMP-normal CBC with minimal anemia Serum hCG-negative EKG-normal sinus rhythm-no acute changes CT of the head-normal noncontrast CT of the brain   She typically does not get lightheadedness upon standing.  Before and after the syncope - no CP, palpitations, SOB.  She has been taking ibuprofen for her headaches.        Medications and allergies reviewed with patient and updated if appropriate.  Patient Active Problem List   Diagnosis Date Noted  . COVID-19 06/15/2019  . Family history of diabetes mellitus in mother 11/03/2018  . Bilateral ovarian cysts 11/03/2018  . Eczema 11/03/2018  . Knee pain, bilateral 11/03/2018    No current outpatient medications on file prior to visit.   No current facility-administered medications on file prior to visit.    Past Medical History:  Diagnosis Date  . Asthma     Past Surgical History:  Procedure Laterality Date  . WISDOM TOOTH EXTRACTION      Social History   Socioeconomic History  . Marital status: Single    Spouse name: Not on file  . Number of children: Not on file  . Years of education: Not on file  . Highest  education level: Not on file  Occupational History  . Not on file  Tobacco Use  . Smoking status: Never Smoker  . Smokeless tobacco: Never Used  Substance and Sexual Activity  . Alcohol use: Yes    Comment: social  . Drug use: No  . Sexual activity: Yes    Partners: Male    Birth control/protection: Condom, Inserts  Other Topics Concern  . Not on file  Social History Narrative  . Not on file   Social Determinants of Health   Financial Resource Strain: Not on file  Food Insecurity: Not on file  Transportation Needs: Not on file  Physical Activity: Not on file  Stress: Not on file  Social Connections: Not on file    Family History  Problem Relation Age of Onset  . Hypertension Father   . Diabetes Maternal Grandmother   . Breast cancer Paternal Grandmother   . Diabetes Paternal Grandmother   . Diabetes Mother     Review of Systems  Constitutional: Negative for fever.  Eyes: Negative for visual disturbance.  Respiratory: Negative for shortness of breath.   Cardiovascular: Negative for chest pain and palpitations.  Gastrointestinal: Positive for nausea and vomiting (1 hour after hitting head).  Neurological: Positive for light-headedness (just before falling and occ since then with change in position)  and headaches. Negative for dizziness, weakness and numbness.  Psychiatric/Behavioral: Negative for confusion. The patient is nervous/anxious (mild - one day since the event).        Objective:   Vitals:   04/17/20 1442  BP: 118/82  Pulse: 75  Temp: 98.2 F (36.8 C)  SpO2: 99%   BP Readings from Last 3 Encounters:  04/17/20 118/82  04/14/20 (!) 120/49  02/29/20 106/72   Wt Readings from Last 3 Encounters:  04/17/20 212 lb (96.2 kg)  04/14/20 195 lb (88.5 kg)  02/29/20 204 lb (92.5 kg)   Body mass index is 38.78 kg/m.   Physical Exam    Constitutional: Appears well-developed and well-nourished. No distress.  HENT:  Head: Normocephalic.  Small bump on  left side of head that is mildly tender  Neck: Neck supple. No tracheal deviation present. No thyromegaly present.  No cervical lymphadenopathy Cardiovascular: Normal rate, regular rhythm and normal heart sounds.  No murmur heard.  No edema Pulmonary/Chest: Effort normal and breath sounds normal. No respiratory distress. No has no wheezes. No rales.  Neuro:  CN II-XII intact, normal strength and sensation, gait normal. Skin: Skin is warm and dry. Not diaphoretic.  Psychiatric: Normal mood and affect. Behavior is normal.   CT HEAD WO CONTRAST CLINICAL DATA:  24 year old female with head trauma.  EXAM: CT HEAD WITHOUT CONTRAST  TECHNIQUE: Contiguous axial images were obtained from the base of the skull through the vertex without intravenous contrast.  COMPARISON:  None.  FINDINGS: Brain: No evidence of acute infarction, hemorrhage, hydrocephalus, extra-axial collection or mass lesion/mass effect.  Vascular: No hyperdense vessel or unexpected calcification.  Skull: Normal. Negative for fracture or focal lesion.  Sinuses/Orbits: No acute finding.  Other: None  IMPRESSION: Normal noncontrast CT of the brain.  Electronically Signed   By: Elgie Collard M.D.   On: 04/14/2020 17:18   Lab Results  Component Value Date   WBC 5.0 04/14/2020   HGB 11.2 (L) 04/14/2020   HCT 34.6 (L) 04/14/2020   PLT 212 04/14/2020   GLUCOSE 103 (H) 04/14/2020   CHOL 137 02/29/2020   TRIG 78.0 02/29/2020   HDL 50.90 02/29/2020   LDLCALC 71 02/29/2020   ALT 10 02/29/2020   AST 15 02/29/2020   NA 137 04/14/2020   K 4.0 04/14/2020   CL 104 04/14/2020   CREATININE 0.94 04/14/2020   BUN 12 04/14/2020   CO2 22 04/14/2020   TSH 1.24 02/29/2020   HGBA1C 5.4 02/29/2020      Assessment & Plan:    I spent 30 minutes dedicated to the care of this patient on the date of this encounter including review of recent labs, imaging and procedures, speciality notes, obtaining history, communicating  with the patient, ordering medications, tests, and documenting clinical information in the EHR   See Problem List for Assessment and Plan of chronic medical problems.    This visit occurred during the SARS-CoV-2 public health emergency.  Safety protocols were in place, including screening questions prior to the visit, additional usage of staff PPE, and extensive cleaning of exam room while observing appropriate contact time as indicated for disinfecting solutions.

## 2020-06-28 NOTE — Progress Notes (Signed)
Subjective:    Patient ID: Laurie Ware, female    DOB: Nov 13, 1995, 25 y.o.   MRN: [id number]  HPI The patient is here for an acute visit.   Bump on chest -she has a new bump in her central chest that has been there for approximately 1 month.  She is here today because it has not gone away.  She has had these in the past and they typically go away on their own without treatment.  She does do warm compresses and that has helped.  This 1 was so painful initially it hurt when she even lifted her arm.  Now it is still tender, but tolerable.  The area is slightly red/dark.  There is no drainage.  She denies any fevers.  She has had several of these in the same area.   Medications and allergies reviewed with patient and updated if appropriate.  Patient Active Problem List   Diagnosis Date Noted  . 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 Concussion 04/17/2020  . COVID-19 06/15/2019  . Family history of diabetes mellitus in mother 11/03/2018  . Bilateral ovarian cysts 11/03/2018  . Eczema 11/03/2018  . Knee pain, bilateral 11/03/2018    No current outpatient medications on file prior to visit.   No current facility-administered medications on file prior to visit.    Past Medical History:  Diagnosis Date  . Asthma     Past Surgical History:  Procedure Laterality Date  . WISDOM TOOTH EXTRACTION      Social History   Socioeconomic History  . Marital status: Single    Spouse name: Not on file  . Number of children: Not on file  . Years of education: Not on file  . Highest education level: Not on file  Occupational History  . Not on file  Tobacco Use  . Smoking status: Never Smoker  . Smokeless tobacco: Never Used  Substance and Sexual Activity  . Alcohol use: Yes    Comment: social  . Drug use: No  . Sexual activity: Yes    Partners: Male    Birth control/protection: Condom, Inserts  Other Topics Concern  . Not on file  Social History Narrative  . Not on file    Social Determinants of Health   Financial Resource Strain: Not on file  Food Insecurity: Not on file  Transportation Needs: Not on file  Physical Activity: Not on file  Stress: Not on file  Social Connections: Not on file    Family History  Problem Relation Age of Onset  . Hypertension Father   . Diabetes Maternal Grandmother   . Breast cancer Paternal Grandmother   . Diabetes Paternal Grandmother   . Diabetes Mother     Review of Systems Per HPI    Objective:   Vitals:   06/29/20 0751  BP: 124/78  Pulse: 78  Temp: 98.4 F (36.9 C)  SpO2: 98%   BP Readings from Last 3 Encounters:  06/29/20 124/78  04/17/20 118/82  04/14/20 (!) 120/49   Wt Readings from Last 3 Encounters:  06/29/20 207 lb (93.9 kg)  04/17/20 212 lb (96.2 kg)  04/14/20 195 lb (88.5 kg)   Body mass index is 37.86 kg/m.   Physical Exam Constitutional:      General: She is not in acute distress.    Appearance: Normal appearance. She is not ill-appearing.  HENT:     Head: Normocephalic and atraumatic.  Skin:    General: Skin is warm and dry.  Findings: Lesion (Mid chest upper left breast cyst that is slightly erythematous, minimally tender, no drainage/bleeding, slight induration surrounding-whole area approximately the size of a nickel.  New lesions starting very top of right breast that is indurated and nonten) present.  Neurological:     Mental Status: She is alert.            Assessment & Plan:    See Problem List for Assessment and Plan of chronic medical problems.    This visit occurred during the SARS-CoV-2 public health emergency.  Safety protocols were in place, including screening questions prior to the visit, additional usage of staff PPE, and extensive cleaning of exam room while observing appropriate contact time as indicated for disinfecting solutions.

## 2020-06-29 ENCOUNTER — Ambulatory Visit (INDEPENDENT_AMBULATORY_CARE_PROVIDER_SITE_OTHER): Payer: 59 | Admitting: Internal Medicine

## 2020-06-29 ENCOUNTER — Encounter: Payer: Self-pay | Admitting: Internal Medicine

## 2020-06-29 ENCOUNTER — Other Ambulatory Visit: Payer: Self-pay

## 2020-06-29 DIAGNOSIS — L02229 Furuncle of trunk, unspecified: Secondary | ICD-10-CM | POA: Diagnosis not present

## 2020-06-29 MED ORDER — SULFAMETHOXAZOLE-TRIMETHOPRIM 800-160 MG PO TABS: 1.0000 | ORAL_TABLET | Freq: Two times a day (BID) | ORAL | 0 refills | Status: DC

## 2020-06-29 NOTE — Assessment & Plan Note (Signed)
Acute Infected boil central chest Has been there over a month-has improved, but not resolving on its own Continue warm compresses Can wash with antibacterial soap Start Bactrim DS 800-150 mg twice daily for 1 week She will call if this does not resolve or with any concerns or questions

## 2020-06-29 NOTE — Patient Instructions (Signed)
Take the antibiotic as prescribed.  Keep doing warm compresses.     Please call if there is no improvement in your symptoms.

## 2020-10-31 ENCOUNTER — Other Ambulatory Visit: Payer: Self-pay

## 2020-10-31 ENCOUNTER — Ambulatory Visit (HOSPITAL_COMMUNITY)
Admission: EM | Admit: 2020-10-31 | Discharge: 2020-10-31 | Disposition: A | Discharge status: Home / Self Care | Payer: BC Managed Care – PPO | Attending: Internal Medicine | Admitting: Internal Medicine

## 2020-10-31 DIAGNOSIS — R002 Palpitations: Secondary | ICD-10-CM | POA: Insufficient documentation

## 2020-10-31 LAB — TSH: TSH: 1.582 u[IU]/mL (ref 0.350–4.500)

## 2020-10-31 LAB — CBC WITH DIFFERENTIAL/PLATELET
Abs Immature Granulocytes: 0.01 10*3/uL (ref 0.00–0.07)
Basophils Absolute: 0.1 10*3/uL (ref 0.0–0.1)
Basophils Relative: 1 %
Eosinophils Absolute: 0.2 10*3/uL (ref 0.0–0.5)
Eosinophils Relative: 3 %
HCT: 33.2 % — ABNORMAL LOW (ref 36.0–46.0)
Hemoglobin: 11.3 g/dL — ABNORMAL LOW (ref 12.0–15.0)
Immature Granulocytes: 0 %
Lymphocytes Relative: 40 %
Lymphs Abs: 2.9 10*3/uL (ref 0.7–4.0)
MCH: 31.7 pg (ref 26.0–34.0)
MCHC: 34 g/dL (ref 30.0–36.0)
MCV: 93 fL (ref 80.0–100.0)
Monocytes Absolute: 0.7 10*3/uL (ref 0.1–1.0)
Monocytes Relative: 10 %
Neutro Abs: 3.4 10*3/uL (ref 1.7–7.7)
Neutrophils Relative %: 46 %
Platelets: 216 10*3/uL (ref 150–400)
RBC: 3.57 MIL/uL — ABNORMAL LOW (ref 3.87–5.11)
RDW: 12.4 % (ref 11.5–15.5)
WBC: 7.2 10*3/uL (ref 4.0–10.5)
nRBC: 0 % (ref 0.0–0.2)

## 2020-10-31 LAB — BASIC METABOLIC PANEL
Anion gap: 5 (ref 5–15)
BUN: 10 mg/dL (ref 6–20)
CO2: 26 mmol/L (ref 22–32)
Calcium: 9.1 mg/dL (ref 8.9–10.3)
Chloride: 104 mmol/L (ref 98–111)
Creatinine, Ser: 0.94 mg/dL (ref 0.44–1.00)
GFR, Estimated: >60 mL/min (ref >60)
Glucose, Bld: 91 mg/dL (ref 70–99)
Potassium: 3.9 mmol/L (ref 3.5–5.1)
Sodium: 135 mmol/L (ref 135–145)

## 2020-10-31 NOTE — Discharge Instructions (Signed)
Your EKG and exam were reassuring.  We will get some blood work today and contact you tomorrow about the results.  If you have worsening of your symptoms, chest pain, difficulty breathing that doesn't improve, you pass out, you should go to the emergency room right away.  Follow up with your PCP in 1-2 days.

## 2020-10-31 NOTE — ED Triage Notes (Signed)
Pt reports her heart beats fast off and on .

## 2020-10-31 NOTE — ED Triage Notes (Signed)
PT did not want to have a EKG in triage.

## 2020-10-31 NOTE — ED Provider Notes (Signed)
[facility]    CSN: [id number] Arrival date & time: 10/31/20  1906      History   Chief Complaint Chief Complaint  Patient presents with   Palpitations    HPI Laurie Ware is a 25 y.o. female.   Palpitations Started last night She states that off and on since last night she has been feeling her heart beat in her throat No abnormal heart beat Thinks it might be going fast when it happens States that she has difficulty breathing only when that happens, but none currently Also has dizziness when she this, but none currently At present she is in her usual state of health Denies fevers or other sick symptoms No chest pain No syncope, but does report a history of vasovagal syncope in January related to hypotension  She has an appointment with her PCP tomorrow or the following day, but was advised to present here to rule out emergent pathology   Past Medical History:  Diagnosis Date   Asthma     Patient Active Problem List   Diagnosis Date Noted   Boil of trunk 06/29/2020   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 Authorizing Provider  sulfamethoxazole-trimethoprim (BACTRIM DS) 800-160 MG tablet Take 1 tablet by mouth 2 (two) times daily. 06/29/20   Pincus Sanes, MD    Family History Family History  Problem Relation Age of Onset   Hypertension Father    Diabetes Maternal Grandmother    Breast cancer  Paternal Grandmother    Diabetes Paternal Grandmother    Diabetes Mother     Social History Social History   Tobacco Use   Smoking status: Never   Smokeless tobacco: Never  Substance Use Topics   Alcohol use: Yes    Comment: social   Drug use: No     Allergies   Patient has no known allergies.   Review of Systems Review of Systems  Constitutional:  Negative for activity change, appetite change, chills, diaphoresis, fatigue and fever.  HENT:  Negative for congestion and sore throat.   Eyes:  Negative for visual disturbance.  Respiratory:  Negative for cough and shortness of breath.   Cardiovascular:  Positive for palpitations. Negative for chest pain and leg swelling.  Gastrointestinal:  Negative for abdominal pain, diarrhea, nausea and vomiting.  Genitourinary:  Negative for difficulty urinating and dysuria.  Skin:  Negative for color change, pallor and rash.  Neurological:  Negative for dizziness, syncope, weakness, numbness and headaches.    Physical Exam Triage Vital Signs ED Triage Vitals  Enc Vitals Group     BP 10/31/20 1945 114/69     Pulse Rate 10/31/20 1945  73     Resp 10/31/20 1945 20     Temp 10/31/20 1945 99.9 F (37.7 C)     Temp Source 10/31/20 1945 Oral     SpO2 10/31/20 1945 100 %     Weight --      Height --      Head Circumference --      Peak Flow --      Pain Score 10/31/20 1946 0     Pain Loc --      Pain Edu? --      Excl. in GC? --    No data found.  Updated Vital Signs BP 114/69   Pulse 73   Temp 99.9 F (37.7 C) (Oral)   Resp 20   LMP 10/10/2020   SpO2 100%   Visual Acuity Right Eye Distance:   Left Eye Distance:   Bilateral Distance:    Right Eye Near:   Left Eye Near:    Bilateral Near:     Physical Exam Constitutional:      General: She is not in acute distress.    Appearance: Normal appearance. She is not ill-appearing, toxic-appearing or diaphoretic.  HENT:     Head: Normocephalic and atraumatic.  Eyes:      Extraocular Movements: Extraocular movements intact.  Neck:     Comments: No thyromegaly Cardiovascular:     Rate and Rhythm: Normal rate and regular rhythm.     Pulses: Normal pulses.     Heart sounds: No murmur heard.   No friction rub. No gallop.  Pulmonary:     Effort: Pulmonary effort is normal. No respiratory distress.     Breath sounds: Normal breath sounds. No wheezing, rhonchi or rales.  Musculoskeletal:     Cervical back: Normal range of motion and neck supple. No rigidity or tenderness.  Skin:    General: Skin is warm and dry.  Neurological:     General: No focal deficit present.     Mental Status: She is alert and oriented to person, place, and time.     Cranial Nerves: No cranial nerve deficit.     Sensory: No sensory deficit.     Comments: Ambulates without difficulty, no dizziness upon standing  Psychiatric:        Mood and Affect: Mood normal.        Behavior: Behavior normal.        Thought Content: Thought content normal.        Judgment: Judgment normal.     UC Treatments / Results  Labs (all labs ordered are listed, but only abnormal results are displayed) Labs Reviewed  CBC WITH DIFFERENTIAL/PLATELET  BASIC METABOLIC PANEL  TSH    EKG   Radiology No results found.  Procedures Procedures (including critical care time)  Medications Ordered in UC Medications - No data to display  Initial Impression / Assessment and Plan / UC Course  I have reviewed the triage vital signs and the nursing notes.  Pertinent labs & imaging results that were available during my care of the patient were reviewed by me and considered in my medical decision making (see chart for details).     Patient is a 25 year old female with past medical history significant for vasovagal syncope who presents clinic 1 day of intermittent palpitations.  Her EKG in clinic is reassuring.  Her vital signs are also reassuring.  Her heart rate is regular on examination.  She does not  have any dizziness upon standing to  suggest orthostasis.  No near syncope reported.  Given her stable vital signs and stable EKG, she is stable for discharge.  We will obtain a BMP, CBC, TSH to rule out underlying etiology.  She will follow-up with her PCP in 1 to 2 days.  She was given ED precautions, see AVS.  Patient was discharged home in stable condition.  Final Clinical Impressions(s) / UC Diagnoses   Final diagnoses:  Palpitations     Discharge Instructions      Your EKG and exam were reassuring.  We will get some blood work today and contact you tomorrow about the results.  If you have worsening of your symptoms, chest pain, difficulty breathing that doesn't improve, you pass out, you should go to the emergency room right away.  Follow up with your PCP in 1-2 days.     ED Prescriptions   None    PDMP not reviewed this encounter.   Unknown Jim, DO 10/31/20 2029

## 2020-11-01 ENCOUNTER — Other Ambulatory Visit: Payer: Self-pay

## 2020-11-02 ENCOUNTER — Ambulatory Visit: Payer: BC Managed Care – PPO | Admitting: Internal Medicine

## 2021-03-21 ENCOUNTER — Telehealth: Payer: BC Managed Care – PPO | Admitting: Physician Assistant

## 2021-03-21 DIAGNOSIS — R6889 Other general symptoms and signs: Secondary | ICD-10-CM

## 2021-03-21 MED ORDER — BENZONATATE 100 MG PO CAPS: 100.0000 mg | ORAL_CAPSULE | Freq: Three times a day (TID) | ORAL | 0 refills | Status: DC | PRN

## 2021-03-21 MED ORDER — LIDOCAINE VISCOUS HCL 2 % MT SOLN: OROMUCOSAL | 0 refills | Status: DC

## 2021-03-21 NOTE — Progress Notes (Signed)
E visit for Flu like symptoms   We are sorry that you are not feeling well.  Here is how we plan to help! Based on what you have shared with me it looks like you may have flu-like symptoms that should be watched but do not seem to indicate anti-viral treatment.  Influenza or the flu is   an infection caused by a respiratory virus. The flu virus is highly contagious and persons who did not receive their yearly flu vaccination may catch the flu from close contact.  We have anti-viral medications to treat the viruses that cause this infection. They are not a cure and only shorten the course of the infection. These prescriptions are most effective when they are given within the first 2 days of flu symptoms. Antiviral medication are indicated if you have a high risk of complications from the flu. You should  also consider an antiviral medication if you are in close contact with someone who is at risk. These medications can help patients avoid complications from the flu  but have side effects that you should know. Possible side effects from Tamiflu or oseltamivir include nausea, vomiting, diarrhea, dizziness, headaches, eye redness, sleep problems or other respiratory symptoms. You should not take Tamiflu if you have an allergy to oseltamivir or any to the ingredients in Tamiflu.  Based upon your symptoms and potential risk factors I recommend that you follow the flu symptoms recommendation that I have listed below.  I have prescribed tessalon perles for cough and viscous lidocaine for the sore throat.   ANYONE WHO HAS FLU SYMPTOMS SHOULD: Stay home. The flu is highly contagious and going out or to work exposes others! Be sure to drink plenty of fluids. Water is fine as well as fruit juices, sodas and electrolyte beverages. You may want to stay away from caffeine or alcohol. If you are nauseated, try taking small sips of liquids. How do you know if you are getting enough fluid? Your urine should be  a pale yellow or almost colorless. Get rest. Taking a steamy shower or using a humidifier may help nasal congestion and ease sore throat pain. Using a saline nasal spray works much the same way. Cough drops, hard candies and sore throat lozenges may ease your cough. Line up a caregiver. Have someone check on you regularly.   GET HELP RIGHT AWAY IF: You cannot keep down liquids or your medications. You become short of breath Your fell like you are going to pass out or loose consciousness. Your symptoms persist after you have completed your treatment plan MAKE SURE YOU  Understand these instructions. Will watch your condition. Will get help right away if you are not doing well or get worse.  Your e-visit answers were reviewed by a board certified advanced clinical practitioner to complete your personal care plan.  Depending on the condition, your plan could have included both over the counter or prescription medications.  If there is a problem please reply  once you have received a response from your provider.  Your safety is important to [place].  If you have drug allergies check your prescription carefully.    You can use MyChart to ask questions about todays visit, request a non-urgent call back, or ask for a work or school excuse for 24 hours related to this e-Visit. If it has been greater than 24 hours you will need to follow up with your provider, or enter a new e-Visit to address those concerns.  You will  get an e-mail in the next two days asking about your experience.  I hope that your e-visit has been valuable and will speed your recovery. Thank you for using e-visits.  I provided 5 minutes of non face-to-face time during this encounter for chart review and documentation.

## 2021-12-18 ENCOUNTER — Encounter: Payer: Self-pay | Admitting: Internal Medicine

## 2021-12-30 ENCOUNTER — Encounter: Payer: Self-pay | Admitting: Internal Medicine

## 2021-12-30 DIAGNOSIS — J45909 Unspecified asthma, uncomplicated: Secondary | ICD-10-CM | POA: Insufficient documentation

## 2021-12-30 NOTE — Progress Notes (Signed)
Subjective:    Patient ID: Laurie Ware, female    DOB: [birth year], 26 y.o.   MRN: [id number]      HPI Laurie Ware is here for a Physical exam.    Getting sores on breasts and recently had some on her nipples.  They can be pus filled and some bleed.  They are very sore.  She washes with dial soap - antibacterial soap.     Numbness in one spot in her toe.  Started after she sat on her foot for a long time - only one spot remained numb-the rest the area of the numbness resolved.  This occurred 2 weeks ago.   Started snoring.  Waking up with headaches.  Her throat is sore when she wakes and is hoarse.     Medications and allergies reviewed with patient and updated if appropriate.  No current outpatient medications on file prior to visit.   No current facility-administered medications on file prior to visit.    Review of Systems  Constitutional:  Negative for fever.  Eyes:  Negative for visual disturbance.  Respiratory:  Positive for cough (x past 3 days). Negative for shortness of breath and wheezing.   Cardiovascular:  Positive for palpitations (anxiety related - mild). Negative for chest pain and leg swelling.  Gastrointestinal:  Negative for abdominal pain, blood in stool, constipation, diarrhea and nausea.       GERD occasional  Genitourinary:  Negative for dysuria.  Musculoskeletal:  Positive for back pain (upper back and neck pain - ? from large breasts). Negative for arthralgias.  Skin:  Positive for rash (eczema - controlled).       Recurrent boils on breasts  Neurological:  Positive for light-headedness and headaches (morning).  Psychiatric/Behavioral:  Positive for dysphoric mood (sometimes). The patient is nervous/anxious (mild).        Objective:   Vitals:   12/31/21 1425  BP: 122/72  Pulse: 65  Temp: 98.3 F (36.8 C)  SpO2: 99%   Filed Weights   12/31/21 1425  Weight: 213 lb (96.6 kg)   Body mass index is 38.96 kg/m.  BP Readings from Last 3  Encounters:  12/31/21 122/72  10/31/20 114/69  06/29/20 124/78    Wt Readings from Last 3 Encounters:  12/31/21 213 lb (96.6 kg)  06/29/20 207 lb (93.9 kg)  04/17/20 212 lb (96.2 kg)       Physical Exam Constitutional: She appears well-developed and well-nourished. No distress.  HENT:  Head: Normocephalic and atraumatic.  Right Ear: External ear normal. Normal ear canal and TM Left Ear: External ear normal.  Normal ear canal and TM Mouth/Throat: Oropharynx is clear and moist.  Eyes: Conjunctivae normal.  Neck: Neck supple. No tracheal deviation present. No thyromegaly present.  No carotid bruit  Cardiovascular: Normal rate, regular rhythm and normal heart sounds.   No murmur heard.  No edema. Pulmonary/Chest: Effort normal and breath sounds normal. No respiratory distress. She has no wheezes. She has no rales.  Breast: deferred   Abdominal: Soft. She exhibits no distension. There is no tenderness.  Lymphadenopathy: She has no cervical adenopathy.  Skin: Skin is warm and dry. She is not diaphoretic.  Psychiatric: She has a normal mood and affect. Her behavior is normal.     Lab Results  Component Value Date   WBC 7.2 10/31/2020   HGB 11.3 (L) 10/31/2020   HCT 33.2 (L) 10/31/2020   PLT 216 10/31/2020   GLUCOSE 91 10/31/2020  CHOL 137 02/29/2020   TRIG 78.0 02/29/2020   HDL 50.90 02/29/2020   LDLCALC 71 02/29/2020   ALT 10 02/29/2020   AST 15 02/29/2020   NA 135 10/31/2020   K 3.9 10/31/2020   CL 104 10/31/2020   CREATININE 0.94 10/31/2020   BUN 10 10/31/2020   CO2 26 10/31/2020   TSH 1.582 10/31/2020   HGBA1C 5.4 02/29/2020         Assessment & Plan:   Physical exam: Screening blood work  ordered Exercise  regular Weight  stressed weight loss Substance abuse  none   Reviewed recommended immunizations.  Flu vaccine today   Health Maintenance  Topic Date Due   HPV VACCINES (1 - 2-dose series) Never done   PAP-Cervical Cytology Screening   05/27/2020   PAP SMEAR-Modifier  05/27/2020   COVID-19 Vaccine (3 - Pfizer series) 01/16/2022 (Originally 11/15/2019)   TETANUS/TDAP  09/07/2023   INFLUENZA VACCINE  Completed   Hepatitis C Screening  Completed   HIV Screening  Completed          See Problem List for Assessment and Plan of chronic medical problems.

## 2021-12-30 NOTE — Patient Instructions (Addendum)
Try using Hibiclens soap.     Flu immunization administered today.     Blood work was ordered.     Medications changes include :   none    A referral was ordered for referral for dermatology and neurology was ordered.      Someone from that office will call you to schedule an appointment.    Return in about 1 year (around 01/01/2023) for Physical Exam.   Health Maintenance, Female Adopting a healthy lifestyle and getting preventive care are important in promoting health and wellness. Ask your health care provider about: The right schedule for you to have regular tests and exams. Things you can do on your own to prevent diseases and keep yourself healthy. What should I know about diet, weight, and exercise? Eat a healthy diet  Eat a diet that includes plenty of vegetables, fruits, low-fat dairy products, and lean protein. Do not eat a lot of foods that are high in solid fats, added sugars, or sodium. Maintain a healthy weight Body mass index (BMI) is used to identify weight problems. It estimates body fat based on height and weight. Your health care provider can help determine your BMI and help you achieve or maintain a healthy weight. Get regular exercise Get regular exercise. This is one of the most important things you can do for your health. Most adults should: Exercise for at least 150 minutes each week. The exercise should increase your heart rate and make you sweat (moderate-intensity exercise). Do strengthening exercises at least twice a week. This is in addition to the moderate-intensity exercise. Spend less time sitting. Even light physical activity can be beneficial. Watch cholesterol and blood lipids Have your blood tested for lipids and cholesterol at 26 years of age, then have this test every 5 years. Have your cholesterol levels checked more often if: Your lipid or cholesterol levels are high. You are older than 26 years of age. You are at high risk for  heart disease. What should I know about cancer screening? Depending on your health history and family history, you may need to have cancer screening at various ages. This may include screening for: Breast cancer. Cervical cancer. Colorectal cancer. Skin cancer. Lung cancer. What should I know about heart disease, diabetes, and high blood pressure? Blood pressure and heart disease High blood pressure causes heart disease and increases the risk of stroke. This is more likely to develop in people who have high blood pressure readings or are overweight. Have your blood pressure checked: Every 3-5 years if you are 7-20 years of age. Every year if you are 58 years old or older. Diabetes Have regular diabetes screenings. This checks your fasting blood sugar level. Have the screening done: Once every three years after age 50 if you are at a normal weight and have a low risk for diabetes. More often and at a younger age if you are overweight or have a high risk for diabetes. What should I know about preventing infection? Hepatitis B If you have a higher risk for hepatitis B, you should be screened for this virus. Talk with your health care provider to find out if you are at risk for hepatitis B infection. Hepatitis C Testing is recommended for: Everyone born from [birth year] through 1965. Anyone with known risk factors for hepatitis C. Sexually transmitted infections (STIs) Get screened for STIs, including gonorrhea and chlamydia, if: You are sexually active and are younger than 26 years of age. You are older  than 26 years of age and your health care provider tells you that you are at risk for this type of infection. Your sexual activity has changed since you were last screened, and you are at increased risk for chlamydia or gonorrhea. Ask your health care provider if you are at risk. Ask your health care provider about whether you are at high risk for HIV. Your health care provider may recommend a  prescription medicine to help prevent HIV infection. If you choose to take medicine to prevent HIV, you should first get tested for HIV. You should then be tested every 3 months for as long as you are taking the medicine. Pregnancy If you are about to stop having your period (premenopausal) and you may become pregnant, seek counseling before you get pregnant. Take 400 to 800 micrograms (mcg) of folic acid every day if you become pregnant. Ask for birth control (contraception) if you want to prevent pregnancy. Osteoporosis and menopause Osteoporosis is a disease in which the bones lose minerals and strength with aging. This can result in bone fractures. If you are 30 years old or older, or if you are at risk for osteoporosis and fractures, ask your health care provider if you should: Be screened for bone loss. Take a calcium or vitamin D supplement to lower your risk of fractures. Be given hormone replacement therapy (HRT) to treat symptoms of menopause. Follow these instructions at home: Alcohol use Do not drink alcohol if: Your health care provider tells you not to drink. You are pregnant, may be pregnant, or are planning to become pregnant. If you drink alcohol: Limit how much you have to: 0-1 drink a day. Know how much alcohol is in your drink. In the [place] one drink equals one 12 oz bottle of beer (355 mL), one 5 oz glass of wine (148 mL), or one 1 oz glass of hard liquor (44 mL). Lifestyle Do not use any products that contain nicotine or tobacco. These products include cigarettes, chewing tobacco, and vaping devices, such as e-cigarettes. If you need help quitting, ask your health care provider. Do not use street drugs. Do not share needles. Ask your health care provider for help if you need support or information about quitting drugs. General instructions Schedule regular health, dental, and eye exams. Stay current with your vaccines. Tell your health care provider if: You often  feel depressed. You have ever been abused or do not feel safe at home. Summary Adopting a healthy lifestyle and getting preventive care are important in promoting health and wellness. Follow your health care provider's instructions about healthy diet, exercising, and getting tested or screened for diseases. Follow your health care provider's instructions on monitoring your cholesterol and blood pressure. This information is not intended to replace advice given to you by your health care provider. Make sure you discuss any questions you have with your health care provider. Document Revised: 08/14/2020 Document Reviewed: 08/14/2020 Elsevier Patient Education  [employer].

## 2021-12-31 ENCOUNTER — Ambulatory Visit (INDEPENDENT_AMBULATORY_CARE_PROVIDER_SITE_OTHER): Payer: Self-pay | Admitting: Internal Medicine

## 2021-12-31 ENCOUNTER — Encounter: Payer: Self-pay | Admitting: Internal Medicine

## 2021-12-31 VITALS — BP 122/72 | HR 65 | Temp 98.3°F | Ht 62.0 in | Wt 213.0 lb

## 2021-12-31 DIAGNOSIS — L732 Hidradenitis suppurativa: Secondary | ICD-10-CM

## 2021-12-31 DIAGNOSIS — E669 Obesity, unspecified: Secondary | ICD-10-CM

## 2021-12-31 DIAGNOSIS — R0683 Snoring: Secondary | ICD-10-CM

## 2021-12-31 DIAGNOSIS — Z0000 Encounter for general adult medical examination without abnormal findings: Secondary | ICD-10-CM

## 2021-12-31 DIAGNOSIS — Z23 Encounter for immunization: Secondary | ICD-10-CM

## 2021-12-31 DIAGNOSIS — Z833 Family history of diabetes mellitus: Secondary | ICD-10-CM

## 2021-12-31 LAB — CBC WITH DIFFERENTIAL/PLATELET
Basophils Absolute: 0 10*3/uL (ref 0.0–0.1)
Basophils Relative: 0.9 % (ref 0.0–3.0)
Eosinophils Absolute: 0.1 10*3/uL (ref 0.0–0.7)
Eosinophils Relative: 3.5 % (ref 0.0–5.0)
HCT: 34.7 % — ABNORMAL LOW (ref 36.0–46.0)
Hemoglobin: 12 g/dL (ref 12.0–15.0)
Lymphocytes Relative: 41.5 % (ref 12.0–46.0)
Lymphs Abs: 1.7 10*3/uL (ref 0.7–4.0)
MCHC: 34.4 g/dL (ref 30.0–36.0)
MCV: 95.2 fl (ref 78.0–100.0)
Monocytes Absolute: 0.7 10*3/uL (ref 0.1–1.0)
Monocytes Relative: 16.8 % — ABNORMAL HIGH (ref 3.0–12.0)
Neutro Abs: 1.5 10*3/uL (ref 1.4–7.7)
Neutrophils Relative %: 37.3 % — ABNORMAL LOW (ref 43.0–77.0)
Platelets: 205 10*3/uL (ref 150.0–400.0)
RBC: 3.65 Mil/uL — ABNORMAL LOW (ref 3.87–5.11)
RDW: 12.6 % (ref 11.5–15.5)
WBC: 4.1 10*3/uL (ref 4.0–10.5)

## 2021-12-31 LAB — COMPREHENSIVE METABOLIC PANEL
ALT: 12 U/L (ref 0–35)
AST: 17 U/L (ref 0–37)
Albumin: 4.3 g/dL (ref 3.5–5.2)
Alkaline Phosphatase: 46 U/L (ref 39–117)
BUN: 14 mg/dL (ref 6–23)
CO2: 28 mEq/L (ref 19–32)
Calcium: 9.4 mg/dL (ref 8.4–10.5)
Chloride: 103 mEq/L (ref 96–112)
Creatinine, Ser: 0.95 mg/dL (ref 0.40–1.20)
GFR: 82.93 mL/min (ref >60.00)
Glucose, Bld: 98 mg/dL (ref 70–99)
Potassium: 3.7 mEq/L (ref 3.5–5.1)
Sodium: 138 mEq/L (ref 135–145)
Total Bilirubin: 0.3 mg/dL (ref 0.2–1.2)
Total Protein: 8 g/dL (ref 6.0–8.3)

## 2021-12-31 LAB — LIPID PANEL
Cholesterol: 133 mg/dL (ref 0–200)
HDL: 43.9 mg/dL (ref >39.00)
LDL Cholesterol: 69 mg/dL (ref 0–99)
NonHDL: 88.98
Total CHOL/HDL Ratio: 3
Triglycerides: 102 mg/dL (ref 0.0–149.0)
VLDL: 20.4 mg/dL (ref 0.0–40.0)

## 2021-12-31 LAB — TSH: TSH: 0.83 u[IU]/mL (ref 0.35–5.50)

## 2021-12-31 NOTE — Assessment & Plan Note (Signed)
Recurrent boils on breasts Referral to dermatology Trial of hibiclens soap

## 2021-12-31 NOTE — Assessment & Plan Note (Addendum)
Chronic Check TSH Advised weight loss Continue regular exercise Encouraged working on diet

## 2021-12-31 NOTE — Assessment & Plan Note (Signed)
Chronic Check a1c Low sugar / carb diet Stressed regular exercise  

## 2021-12-31 NOTE — Assessment & Plan Note (Signed)
She states she does snore Also states waking up with headaches Family history of sleep apnea-her father has it Discussed possible sleep apnea and evaluation for-she agrees Referral to neurology

## 2022-01-01 LAB — HEMOGLOBIN A1C: Hgb A1c MFr Bld: 5.6 % (ref 4.6–6.5)

## 2022-02-13 ENCOUNTER — Encounter: Payer: Self-pay | Admitting: Internal Medicine

## 2022-02-13 ENCOUNTER — Telehealth: Payer: Self-pay | Admitting: Internal Medicine

## 2022-02-13 NOTE — Telephone Encounter (Signed)
Pt called requesting documentation of receiving her flu shot on 12/31/21. Printed. Faxed to patient at [phone]

## 2022-03-09 DIAGNOSIS — Z708 Other sex counseling: Secondary | ICD-10-CM | POA: Diagnosis not present

## 2022-03-09 DIAGNOSIS — Z6838 Body mass index (BMI) 38.0-38.9, adult: Secondary | ICD-10-CM | POA: Diagnosis not present

## 2022-05-21 IMAGING — CT CT HEAD W/O CM
3 series · 15 of 47 positions shown, 18 images · non-contrast
Comparison: None.

CLINICAL DATA: 24-year-old female with head trauma.

EXAM:
CT HEAD WITHOUT CONTRAST
TECHNIQUE: Contiguous axial images were obtained from the base of the skull
through the vertex without intravenous contrast.

[Series 3: head 5.0 h30s · axial · 0.42mm/px · z∈[-93,+37]mm · 9 of 32 slices shown, 12 images]
[im 3/32  brain]
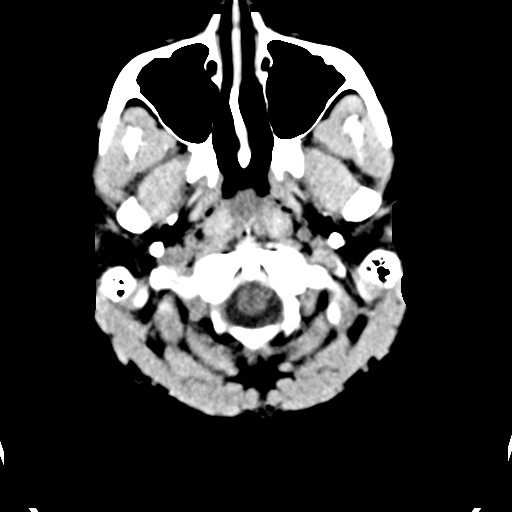
[im 3/32  bone]
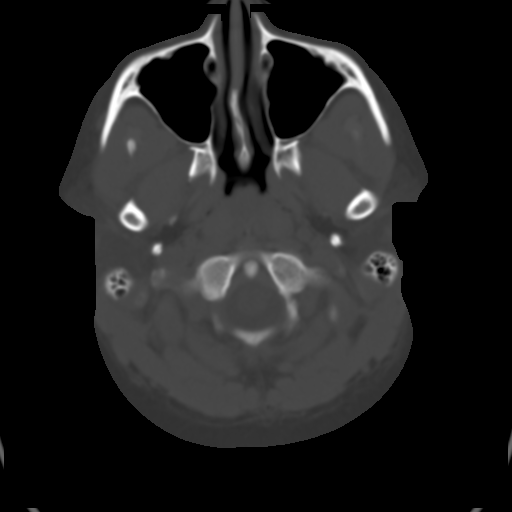
[im 6/32  brain]
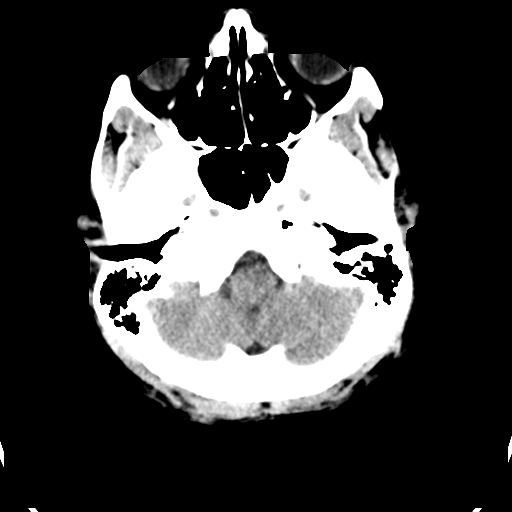
[im 9/32  brain]
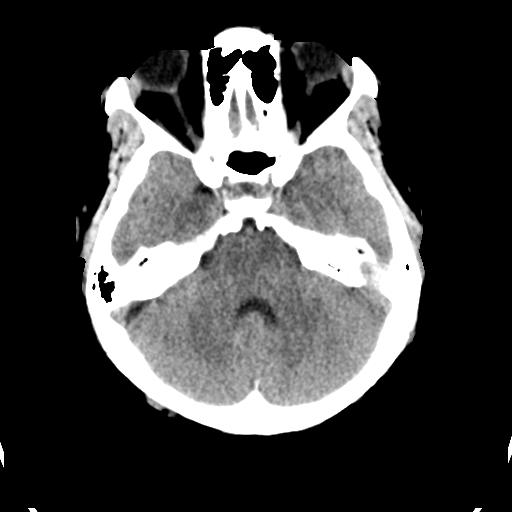
[im 12/32  brain]
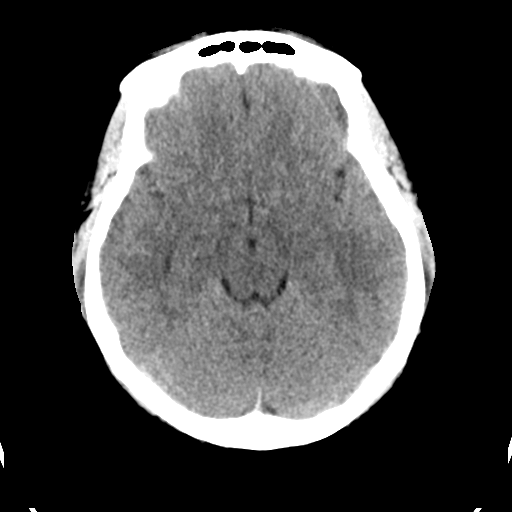
[im 17/32  brain]
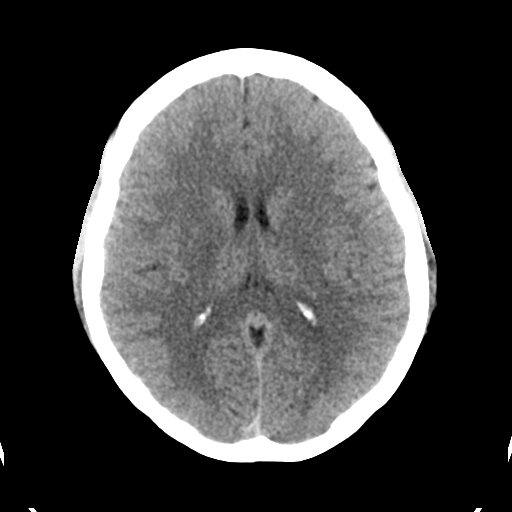
[im 17/32  bone]
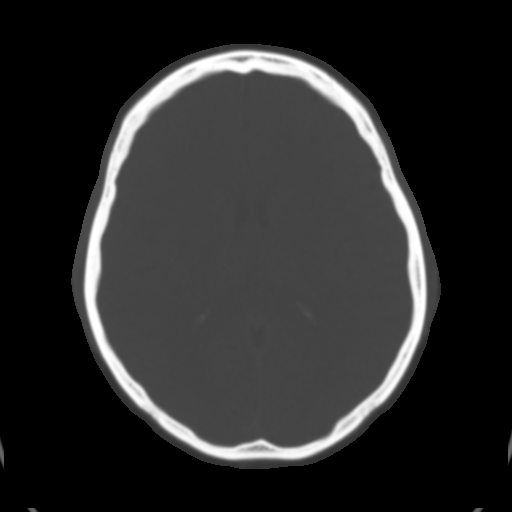
[im 20/32  brain]
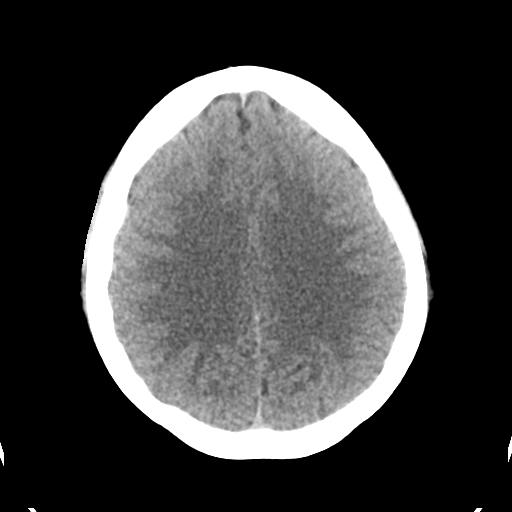
[im 23/32  brain]
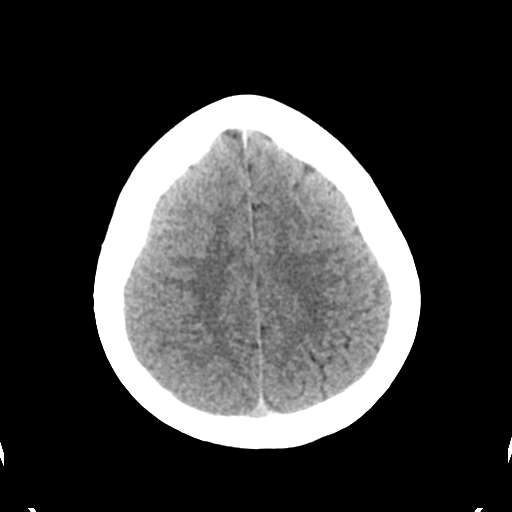
[im 26/32  brain]
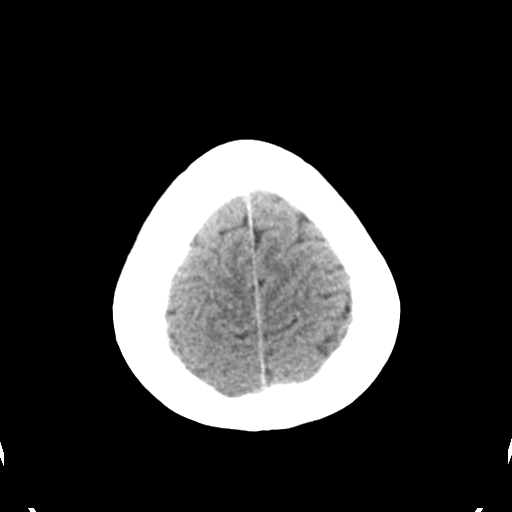
[im 29/32  brain]
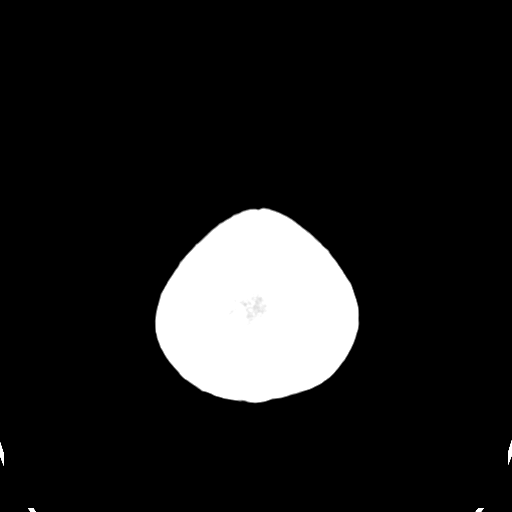
[im 29/32  bone]
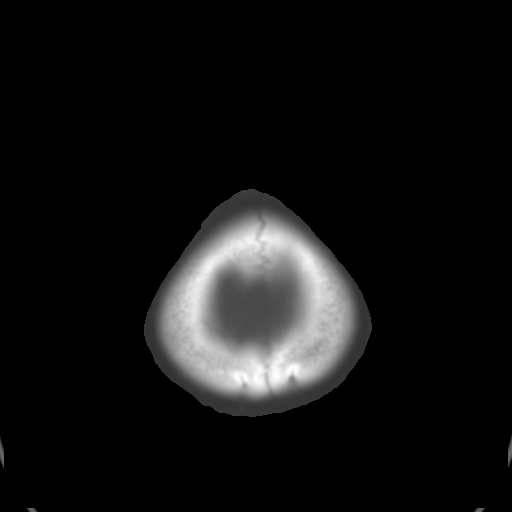

[Series 5: head 3.0 mpr cor · coronal · 0.31mm/px · 3 of 70 slices shown]
[im 24/70  brain]
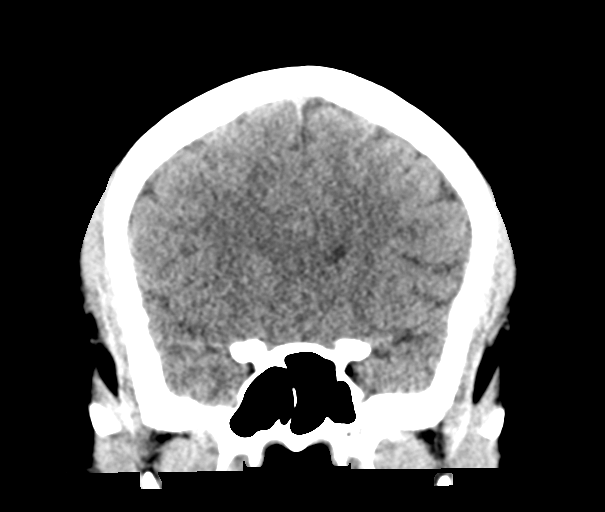
[im 31/70  brain]
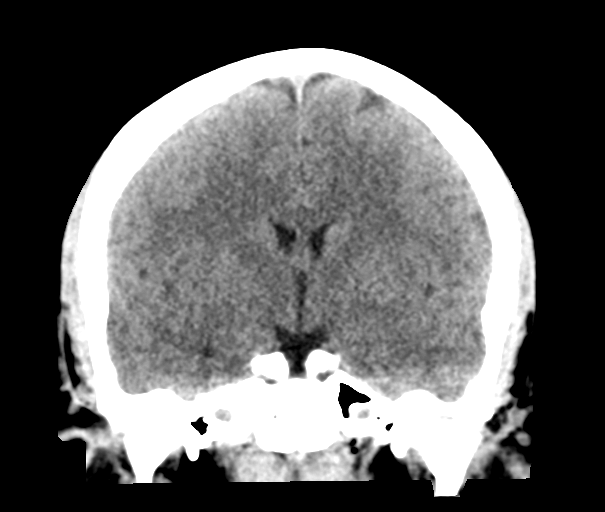
[im 39/70  brain]
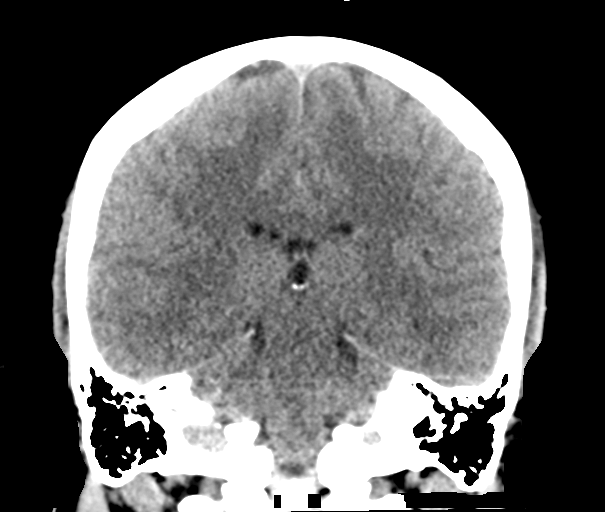

[Series 6: head 3.0 mpr sag · sagittal · 0.31mm/px · 3 of 61 slices shown]
[im 21/61  brain]
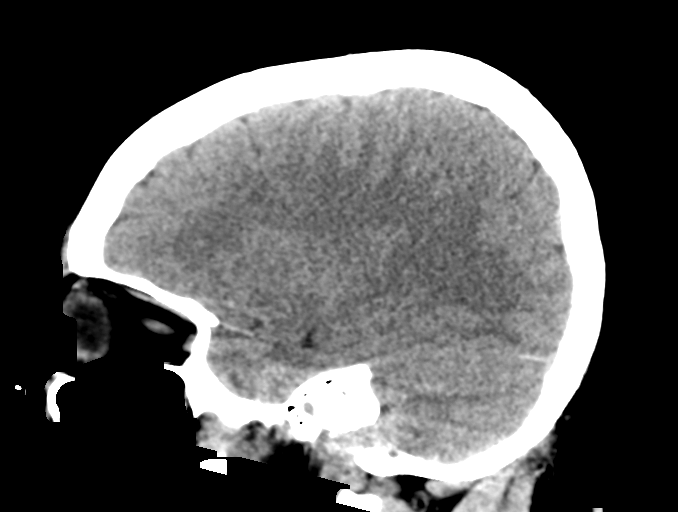
[im 31/61  brain]
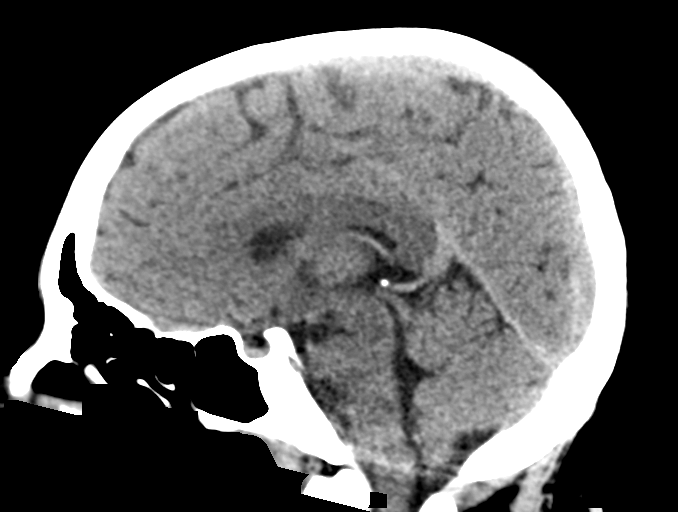
[im 41/61  brain]
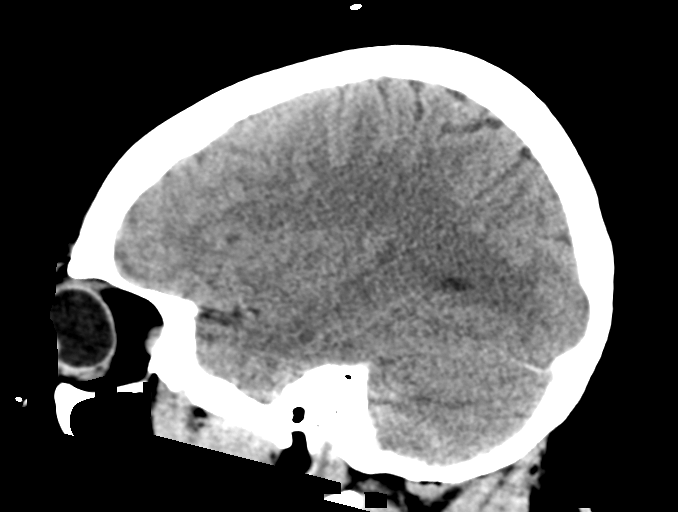

[15 of 47 positions shown; findings below may reference images not displayed]

FINDINGS: Brain: No evidence of acute infarction, hemorrhage, hydrocephalus,
extra-axial collection or mass lesion/mass effect.

Vascular: No hyperdense vessel or unexpected calcification.

Skull: Normal. Negative for fracture or focal lesion.

Sinuses/Orbits: No acute finding.

Other: None
IMPRESSION: Normal noncontrast CT of the brain.

## 2022-09-30 ENCOUNTER — Encounter: Payer: Self-pay | Admitting: Internal Medicine

## 2022-11-21 ENCOUNTER — Encounter (INDEPENDENT_AMBULATORY_CARE_PROVIDER_SITE_OTHER): Payer: Self-pay

## 2023-01-02 ENCOUNTER — Encounter: Payer: Self-pay | Admitting: Internal Medicine

## 2023-01-19 DIAGNOSIS — R7303 Prediabetes: Secondary | ICD-10-CM | POA: Insufficient documentation

## 2023-01-19 DIAGNOSIS — R739 Hyperglycemia, unspecified: Secondary | ICD-10-CM | POA: Insufficient documentation

## 2023-01-19 NOTE — Progress Notes (Unsigned)
Subjective:    Patient ID: Laurie Ware, female    DOB: [birth year], 27 y.o.   MRN: [id number]      HPI Trysta is here for a Physical exam and her chronic medical problems.    Hard time losing weight -she is exercising regularly and has been watching her calorie intake.  She feels like she is eating very healthy.  She is frustrated by the inability to lose weight.  Having GERD x 3-4 months - pretty much feels it daily. Has it first thing in the morning - takes Tums and drinks water.    ? PCOS - heavy menses, slightly irregularity, some acne, hair growth.  She did discuss with her gynecologist who did not think that she had PCOS    Medications and allergies reviewed with patient and updated if appropriate.  Current Outpatient Medications on File Prior to Visit  Medication Sig Dispense Refill   ferrous sulfate 325 (65 FE) MG EC tablet Take 325 mg by mouth daily with breakfast.     Multiple Vitamin (MULTIVITAMIN) tablet Take 1 tablet by mouth daily.     No current facility-administered medications on file prior to visit.    Review of Systems  Constitutional:  Negative for fever.  Eyes:  Negative for visual disturbance.  Respiratory:  Positive for cough (sometimes). Negative for shortness of breath and wheezing.   Cardiovascular:  Positive for palpitations (anxiety related). Negative for chest pain and leg swelling.  Gastrointestinal:  Negative for abdominal pain, blood in stool, constipation and diarrhea.        gerd  Genitourinary:  Negative for dysuria.  Musculoskeletal:  Positive for arthralgias (right shoulder pain). Negative for back pain.  Skin:  Negative for rash.  Neurological:  Negative for light-headedness and headaches.  Psychiatric/Behavioral:  Negative for dysphoric mood. The patient is nervous/anxious.        Objective:   Vitals:   01/20/23 0842  BP: 110/68  Pulse: 92  Temp: 98 F (36.7 C)  SpO2: 96%   Filed Weights   01/20/23 0842  Weight:  213 lb (96.6 kg)   Body mass index is 38.96 kg/m.  BP Readings from Last 3 Encounters:  01/20/23 110/68  12/31/21 122/72  10/31/20 114/69    Wt Readings from Last 3 Encounters:  01/20/23 213 lb (96.6 kg)  12/31/21 213 lb (96.6 kg)  06/29/20 207 lb (93.9 kg)       Physical Exam Constitutional: She appears well-developed and well-nourished. No distress.  HENT:  Head: Normocephalic and atraumatic.  Right Ear: External ear normal. Normal ear canal and TM Left Ear: External ear normal.  Normal ear canal and TM Mouth/Throat: Oropharynx is clear and moist.  Eyes: Conjunctivae normal.  Neck: Neck supple. No tracheal deviation present. No thyromegaly present.  No carotid bruit  Cardiovascular: Normal rate, regular rhythm and normal heart sounds.   No murmur heard.  No edema. Pulmonary/Chest: Effort normal and breath sounds normal. No respiratory distress. She has no wheezes. She has no rales.  Breast: deferred   Abdominal: Soft. She exhibits no distension. There is no tenderness.  Lymphadenopathy: She has no cervical adenopathy.  Skin: Skin is warm and dry. She is not diaphoretic.  Psychiatric: She has a normal mood and affect. Her behavior is normal.     Lab Results  Component Value Date   WBC 4.1 12/31/2021   HGB 12.0 12/31/2021   HCT 34.7 (L) 12/31/2021   PLT 205.0 12/31/2021   GLUCOSE  98 12/31/2021   CHOL 133 12/31/2021   TRIG 102.0 12/31/2021   HDL 43.90 12/31/2021   LDLCALC 69 12/31/2021   ALT 12 12/31/2021   AST 17 12/31/2021   NA 138 12/31/2021   K 3.7 12/31/2021   CL 103 12/31/2021   CREATININE 0.95 12/31/2021   BUN 14 12/31/2021   CO2 28 12/31/2021   TSH 0.83 12/31/2021   HGBA1C 5.6 12/31/2021         Assessment & Plan:   Physical exam: Screening blood work  ordered Exercise  regular - gym Weight  obese - encouraged weight loss Substance abuse  none   Reviewed recommended immunizations.   Health Maintenance  Topic Date Due   Cervical  Cancer Screening (Pap smear)  05/27/2020   COVID-19 Vaccine (3 - 2023-24 season) 02/05/2023 (Originally 12/08/2022)   INFLUENZA VACCINE  07/07/2023 (Originally 11/07/2022)   DTaP/Tdap/Td (2 - Td or Tdap) 09/07/2023   Hepatitis C Screening  Completed   HIV Screening  Completed   HPV VACCINES  Aged Out          See Problem List for Assessment and Plan of chronic medical problems.

## 2023-01-19 NOTE — Patient Instructions (Addendum)
Blood work was ordered.   The lab is on the first floor.    Medications changes include :   metformin xr 500 mg daily, pepcid 40 mg daily - take until heartburn is controlled then taper off and use as needed    Return in about 3 months (around 04/22/2023) for follow up.    Health Maintenance, Female Adopting a healthy lifestyle and getting preventive care are important in promoting health and wellness. Ask your health care provider about: The right schedule for you to have regular tests and exams. Things you can do on your own to prevent diseases and keep yourself healthy. What should I know about diet, weight, and exercise? Eat a healthy diet  Eat a diet that includes plenty of vegetables, fruits, low-fat dairy products, and lean protein. Do not eat a lot of foods that are high in solid fats, added sugars, or sodium. Maintain a healthy weight Body mass index (BMI) is used to identify weight problems. It estimates body fat based on height and weight. Your health care provider can help determine your BMI and help you achieve or maintain a healthy weight. Get regular exercise Get regular exercise. This is one of the most important things you can do for your health. Most adults should: Exercise for at least 150 minutes each week. The exercise should increase your heart rate and make you sweat (moderate-intensity exercise). Do strengthening exercises at least twice a week. This is in addition to the moderate-intensity exercise. Spend less time sitting. Even light physical activity can be beneficial. Watch cholesterol and blood lipids Have your blood tested for lipids and cholesterol at 27 years of age, then have this test every 5 years. Have your cholesterol levels checked more often if: Your lipid or cholesterol levels are high. You are older than 27 years of age. You are at high risk for heart disease. What should I know about cancer screening? Depending on your health  history and family history, you may need to have cancer screening at various ages. This may include screening for: Breast cancer. Cervical cancer. Colorectal cancer. Skin cancer. Lung cancer. What should I know about heart disease, diabetes, and high blood pressure? Blood pressure and heart disease High blood pressure causes heart disease and increases the risk of stroke. This is more likely to develop in people who have high blood pressure readings or are overweight. Have your blood pressure checked: Every 3-5 years if you are 70-10 years of age. Every year if you are 38 years old or older. Diabetes Have regular diabetes screenings. This checks your fasting blood sugar level. Have the screening done: Once every three years after age 54 if you are at a normal weight and have a low risk for diabetes. More often and at a younger age if you are overweight or have a high risk for diabetes. What should I know about preventing infection? Hepatitis B If you have a higher risk for hepatitis B, you should be screened for this virus. Talk with your health care provider to find out if you are at risk for hepatitis B infection. Hepatitis C Testing is recommended for: Everyone born from [birth year] through 1965. Anyone with known risk factors for hepatitis C. Sexually transmitted infections (STIs) Get screened for STIs, including gonorrhea and chlamydia, if: You are sexually active and are younger than 27 years of age. You are older than 27 years of age and your health care provider tells you that you are  at risk for this type of infection. Your sexual activity has changed since you were last screened, and you are at increased risk for chlamydia or gonorrhea. Ask your health care provider if you are at risk. Ask your health care provider about whether you are at high risk for HIV. Your health care provider may recommend a prescription medicine to help prevent HIV infection. If you choose to take medicine to  prevent HIV, you should first get tested for HIV. You should then be tested every 3 months for as long as you are taking the medicine. Pregnancy If you are about to stop having your period (premenopausal) and you may become pregnant, seek counseling before you get pregnant. Take 400 to 800 micrograms (mcg) of folic acid every day if you become pregnant. Ask for birth control (contraception) if you want to prevent pregnancy. Osteoporosis and menopause Osteoporosis is a disease in which the bones lose minerals and strength with aging. This can result in bone fractures. If you are 39 years old or older, or if you are at risk for osteoporosis and fractures, ask your health care provider if you should: Be screened for bone loss. Take a calcium or vitamin D supplement to lower your risk of fractures. Be given hormone replacement therapy (HRT) to treat symptoms of menopause. Follow these instructions at home: Alcohol use Do not drink alcohol if: Your health care provider tells you not to drink. You are pregnant, may be pregnant, or are planning to become pregnant. If you drink alcohol: Limit how much you have to: 0-1 drink a day. Know how much alcohol is in your drink. In the [place] one drink equals one 12 oz bottle of beer (355 mL), one 5 oz glass of wine (148 mL), or one 1 oz glass of hard liquor (44 mL). Lifestyle Do not use any products that contain nicotine or tobacco. These products include cigarettes, chewing tobacco, and vaping devices, such as e-cigarettes. If you need help quitting, ask your health care provider. Do not use street drugs. Do not share needles. Ask your health care provider for help if you need support or information about quitting drugs. General instructions Schedule regular health, dental, and eye exams. Stay current with your vaccines. Tell your health care provider if: You often feel depressed. You have ever been abused or do not feel safe at  home. Summary Adopting a healthy lifestyle and getting preventive care are important in promoting health and wellness. Follow your health care provider's instructions about healthy diet, exercising, and getting tested or screened for diseases. Follow your health care provider's instructions on monitoring your cholesterol and blood pressure. This information is not intended to replace advice given to you by your health care provider. Make sure you discuss any questions you have with your health care provider. Document Revised: 08/14/2020 Document Reviewed: 08/14/2020 Elsevier Patient Education  2024 [employer].

## 2023-01-20 ENCOUNTER — Encounter: Payer: Self-pay | Admitting: Internal Medicine

## 2023-01-20 ENCOUNTER — Ambulatory Visit (INDEPENDENT_AMBULATORY_CARE_PROVIDER_SITE_OTHER): Payer: Self-pay | Admitting: Internal Medicine

## 2023-01-20 VITALS — BP 110/68 | HR 92 | Temp 98.0°F | Ht 62.0 in | Wt 213.0 lb

## 2023-01-20 DIAGNOSIS — Z0000 Encounter for general adult medical examination without abnormal findings: Secondary | ICD-10-CM

## 2023-01-20 DIAGNOSIS — F419 Anxiety disorder, unspecified: Secondary | ICD-10-CM

## 2023-01-20 DIAGNOSIS — K219 Gastro-esophageal reflux disease without esophagitis: Secondary | ICD-10-CM | POA: Insufficient documentation

## 2023-01-20 DIAGNOSIS — R739 Hyperglycemia, unspecified: Secondary | ICD-10-CM

## 2023-01-20 DIAGNOSIS — E669 Obesity, unspecified: Secondary | ICD-10-CM

## 2023-01-20 LAB — CBC WITH DIFFERENTIAL/PLATELET
Basophils Absolute: 0 10*3/uL (ref 0.0–0.1)
Basophils Relative: 0.7 % (ref 0.0–3.0)
Eosinophils Absolute: 0.1 10*3/uL (ref 0.0–0.7)
Eosinophils Relative: 1.6 % (ref 0.0–5.0)
HCT: 33.5 % — ABNORMAL LOW (ref 36.0–46.0)
Hemoglobin: 11.2 g/dL — ABNORMAL LOW (ref 12.0–15.0)
Lymphocytes Relative: 25 % (ref 12.0–46.0)
Lymphs Abs: 1.4 10*3/uL (ref 0.7–4.0)
MCHC: 33.5 g/dL (ref 30.0–36.0)
MCV: 95.4 fL (ref 78.0–100.0)
Monocytes Absolute: 0.5 10*3/uL (ref 0.1–1.0)
Monocytes Relative: 9.2 % (ref 3.0–12.0)
Neutro Abs: 3.6 10*3/uL (ref 1.4–7.7)
Neutrophils Relative %: 63.5 % (ref 43.0–77.0)
Platelets: 248 10*3/uL (ref 150.0–400.0)
RBC: 3.51 Mil/uL — ABNORMAL LOW (ref 3.87–5.11)
RDW: 12.9 % (ref 11.5–15.5)
WBC: 5.7 10*3/uL (ref 4.0–10.5)

## 2023-01-20 LAB — COMPREHENSIVE METABOLIC PANEL
ALT: 11 U/L (ref 0–35)
AST: 16 U/L (ref 0–37)
Albumin: 4.3 g/dL (ref 3.5–5.2)
Alkaline Phosphatase: 46 U/L (ref 39–117)
BUN: 13 mg/dL (ref 6–23)
CO2: 27 meq/L (ref 19–32)
Calcium: 9.8 mg/dL (ref 8.4–10.5)
Chloride: 103 meq/L (ref 96–112)
Creatinine, Ser: 1.05 mg/dL (ref 0.40–1.20)
GFR: 73.01 mL/min (ref >60.00)
Glucose, Bld: 101 mg/dL — ABNORMAL HIGH (ref 70–99)
Potassium: 4 meq/L (ref 3.5–5.1)
Sodium: 136 meq/L (ref 135–145)
Total Bilirubin: 0.2 mg/dL (ref 0.2–1.2)
Total Protein: 7.9 g/dL (ref 6.0–8.3)

## 2023-01-20 LAB — LIPID PANEL
Cholesterol: 140 mg/dL (ref 0–200)
HDL: 47 mg/dL (ref >39.00)
LDL Cholesterol: 71 mg/dL (ref 0–99)
NonHDL: 92.72
Total CHOL/HDL Ratio: 3
Triglycerides: 107 mg/dL (ref 0.0–149.0)
VLDL: 21.4 mg/dL (ref 0.0–40.0)

## 2023-01-20 LAB — TSH: TSH: 1.11 u[IU]/mL (ref 0.35–5.50)

## 2023-01-20 LAB — HEMOGLOBIN A1C: Hgb A1c MFr Bld: 5.7 % (ref 4.6–6.5)

## 2023-01-20 MED ORDER — FAMOTIDINE 40 MG PO TABS: 40.0000 mg | ORAL_TABLET | Freq: Every day | ORAL | 5 refills | Status: DC | PRN

## 2023-01-20 MED ORDER — METFORMIN HCL ER 500 MG PO TB24: 500.0000 mg | ORAL_TABLET | Freq: Every day | ORAL | 5 refills | Status: DC

## 2023-01-20 NOTE — Assessment & Plan Note (Signed)
New Have frequent GERD for a few months Start pepcid 40 mg daily - once GERD is controlled can try to slowly taper off Working on weight loss Reviewed GERD diet

## 2023-01-20 NOTE — Assessment & Plan Note (Signed)
Chronic Deferred medication Working on lifestyle changes to help with anxiety Continue regular exercise

## 2023-01-20 NOTE — Assessment & Plan Note (Addendum)
Chronic Check TSH, CBC, CMP, lipid, A1c Advised weight loss - she is working on it Continue regular exercise Advised decreasing calorie intake Encouraged working on diet-advised decreased portions, diet high in protein, fiber, vegetables Start metformin xr 500 mg daily - will hopefully help with sugars, weight and ? pcos

## 2023-01-20 NOTE — Assessment & Plan Note (Addendum)
Chronic Check A1c, insulin - random level Low sugar / carb diet Stressed regular exercise

## 2023-01-21 LAB — INSULIN, RANDOM: Insulin: 21 u[IU]/mL — ABNORMAL HIGH

## 2023-04-20 ENCOUNTER — Encounter: Payer: Self-pay | Admitting: Internal Medicine

## 2023-04-20 NOTE — Progress Notes (Signed)
      Subjective:    Patient ID: Laurie Ware, female    DOB: November 14, 1995, 28 y.o.   MRN: [id number]     HPI Laurie Ware is here for follow up of her chronic medical problems.  3 months ago started metformin  and pepcid .    Medications and allergies reviewed with patient and updated if appropriate.  Current Outpatient Medications on File Prior to Visit  Medication Sig Dispense Refill   famotidine  (PEPCID ) 40 MG tablet Take 1 tablet (40 mg total) by mouth daily as needed for heartburn or indigestion. 30 tablet 5   ferrous sulfate 325 (65 FE) MG EC tablet Take 325 mg by mouth daily with breakfast.     metFORMIN  (GLUCOPHAGE -XR) 500 MG 24 hr tablet Take 1 tablet (500 mg total) by mouth daily with breakfast. 30 tablet 5   Multiple Vitamin (MULTIVITAMIN) tablet Take 1 tablet by mouth daily.     No current facility-administered medications on file prior to visit.     Review of Systems     Objective:  There were no vitals filed for this visit. BP Readings from Last 3 Encounters:  01/20/23 110/68  12/31/21 122/72  10/31/20 114/69   Wt Readings from Last 3 Encounters:  01/20/23 213 lb (96.6 kg)  12/31/21 213 lb (96.6 kg)  06/29/20 207 lb (93.9 kg)   There is no height or weight on file to calculate BMI.    Physical Exam     Lab Results  Component Value Date   WBC 5.7 01/20/2023   HGB 11.2 (L) 01/20/2023   HCT 33.5 (L) 01/20/2023   PLT 248.0 01/20/2023   GLUCOSE 101 (H) 01/20/2023   CHOL 140 01/20/2023   TRIG 107.0 01/20/2023   HDL 47.00 01/20/2023   LDLCALC 71 01/20/2023   ALT 11 01/20/2023   AST 16 01/20/2023   NA 136 01/20/2023   K 4.0 01/20/2023   CL 103 01/20/2023   CREATININE 1.05 01/20/2023   BUN 13 01/20/2023   CO2 27 01/20/2023   TSH 1.11 01/20/2023   HGBA1C 5.7 01/20/2023     Assessment & Plan:    See Problem List for Assessment and Plan of chronic medical problems.   This encounter was created in error - please disregard.

## 2023-04-20 NOTE — Patient Instructions (Addendum)
      Blood work was ordered.       Medications changes include :   None    A referral was ordered and someone will call you to schedule an appointment.     Return for Physical Exam end of october.

## 2023-04-22 ENCOUNTER — Encounter: Payer: Self-pay | Admitting: Internal Medicine

## 2024-03-16 ENCOUNTER — Encounter: Payer: Self-pay | Admitting: Internal Medicine

## 2024-03-16 DIAGNOSIS — D649 Anemia, unspecified: Secondary | ICD-10-CM | POA: Insufficient documentation

## 2024-03-16 NOTE — Patient Instructions (Addendum)

## 2024-03-16 NOTE — Progress Notes (Unsigned)
 Subjective:    Patient ID: Laurie Ware, female    DOB: 02-03-1996, 28 y.o.   MRN: [id number]      HPI Laurie Ware is here for a Physical exam and her chronic medical problems.   ? Pcos or endometriosis   Medications and allergies reviewed with patient and updated if appropriate.  Current Outpatient Medications on File Prior to Visit  Medication Sig Dispense Refill   famotidine  (PEPCID ) 40 MG tablet Take 1 tablet (40 mg total) by mouth daily as needed for heartburn or indigestion. 30 tablet 5   ferrous sulfate 325 (65 FE) MG EC tablet Take 325 mg by mouth daily with breakfast.     metFORMIN  (GLUCOPHAGE -XR) 500 MG 24 hr tablet Take 1 tablet (500 mg total) by mouth daily with breakfast. 30 tablet 5   Multiple Vitamin (MULTIVITAMIN) tablet Take 1 tablet by mouth daily.     No current facility-administered medications on file prior to visit.    Review of Systems     Objective:  There were no vitals filed for this visit. There were no vitals filed for this visit. There is no height or weight on file to calculate BMI.  BP Readings from Last 3 Encounters:  01/20/23 110/68  12/31/21 122/72  10/31/20 114/69    Wt Readings from Last 3 Encounters:  01/20/23 213 lb (96.6 kg)  12/31/21 213 lb (96.6 kg)  06/29/20 207 lb (93.9 kg)       Physical Exam Constitutional: She appears well-developed and well-nourished. No distress.  HENT:  Head: Normocephalic and atraumatic.  Right Ear: External ear normal. Normal ear canal and TM Left Ear: External ear normal.  Normal ear canal and TM Mouth/Throat: Oropharynx is clear and moist.  Eyes: Conjunctivae normal.  Neck: Neck supple. No tracheal deviation present. No thyromegaly present.  No carotid bruit  Cardiovascular: Normal rate, regular rhythm and normal heart sounds.   No murmur heard.  No edema. Pulmonary/Chest: Effort normal and breath sounds normal. No respiratory distress. She has no wheezes. She has no rales.  Breast:  deferred   Abdominal: Soft. She exhibits no distension. There is no tenderness.  Lymphadenopathy: She has no cervical adenopathy.  Skin: Skin is warm and dry. She is not diaphoretic.  Psychiatric: She has a normal mood and affect. Her behavior is normal.     Lab Results  Component Value Date   WBC 5.7 01/20/2023   HGB 11.2 (L) 01/20/2023   HCT 33.5 (L) 01/20/2023   PLT 248.0 01/20/2023   GLUCOSE 101 (H) 01/20/2023   CHOL 140 01/20/2023   TRIG 107.0 01/20/2023   HDL 47.00 01/20/2023   LDLCALC 71 01/20/2023   ALT 11 01/20/2023   AST 16 01/20/2023   NA 136 01/20/2023   K 4.0 01/20/2023   CL 103 01/20/2023   CREATININE 1.05 01/20/2023   BUN 13 01/20/2023   CO2 27 01/20/2023   TSH 1.11 01/20/2023   HGBA1C 5.7 01/20/2023         Assessment & Plan:   Physical exam: Screening blood work  ordered Exercise   Weight   Substance abuse  none   Reviewed recommended immunizations.   Health Maintenance  Topic Date Due   Pneumococcal Vaccine (1 of 2 - PCV) Never done   Hepatitis B Vaccines 19-59 Average Risk (1 of 3 - 19+ 3-dose series) Never done   Cervical Cancer Screening (Pap smear)  05/27/2020   HPV VACCINES (1 - 3-dose SCDM series) Never done  DTaP/Tdap/Td (2 - Td or Tdap) 09/07/2023   Influenza Vaccine  11/07/2023   COVID-19 Vaccine (3 - 2025-26 season) 12/08/2023   Hepatitis C Screening  Completed   HIV Screening  Completed   Meningococcal B Vaccine  Aged Out          See Problem List for Assessment and Plan of chronic medical problems.

## 2024-03-17 ENCOUNTER — Ambulatory Visit: Payer: Self-pay | Admitting: Internal Medicine

## 2024-03-17 VITALS — BP 100/72 | HR 62 | Temp 98.7°F | Ht 62.0 in | Wt 213.0 lb

## 2024-03-17 DIAGNOSIS — K219 Gastro-esophageal reflux disease without esophagitis: Secondary | ICD-10-CM

## 2024-03-17 DIAGNOSIS — E669 Obesity, unspecified: Secondary | ICD-10-CM

## 2024-03-17 DIAGNOSIS — D649 Anemia, unspecified: Secondary | ICD-10-CM

## 2024-03-17 DIAGNOSIS — R7303 Prediabetes: Secondary | ICD-10-CM

## 2024-03-17 DIAGNOSIS — E282 Polycystic ovarian syndrome: Secondary | ICD-10-CM | POA: Insufficient documentation

## 2024-03-17 DIAGNOSIS — F419 Anxiety disorder, unspecified: Secondary | ICD-10-CM

## 2024-03-17 DIAGNOSIS — Z0000 Encounter for general adult medical examination without abnormal findings: Secondary | ICD-10-CM

## 2024-03-17 LAB — IBC PANEL
Iron: 54 ug/dL (ref 42–145)
Saturation Ratios: 14.6 % — ABNORMAL LOW (ref 20.0–50.0)
TIBC: 369.6 ug/dL (ref 250.0–450.0)
Transferrin: 264 mg/dL (ref 212.0–360.0)

## 2024-03-17 LAB — COMPREHENSIVE METABOLIC PANEL WITH GFR
ALT: 11 U/L (ref 0–35)
AST: 18 U/L (ref 0–37)
Albumin: 4.3 g/dL (ref 3.5–5.2)
Alkaline Phosphatase: 43 U/L (ref 39–117)
BUN: 21 mg/dL (ref 6–23)
CO2: 26 meq/L (ref 19–32)
Calcium: 9.5 mg/dL (ref 8.4–10.5)
Chloride: 103 meq/L (ref 96–112)
Creatinine, Ser: 1.13 mg/dL (ref 0.40–1.20)
GFR: 66.31 mL/min (ref >60.00)
Glucose, Bld: 87 mg/dL (ref 70–99)
Potassium: 4.3 meq/L (ref 3.5–5.1)
Sodium: 135 meq/L (ref 135–145)
Total Bilirubin: 0.3 mg/dL (ref 0.2–1.2)
Total Protein: 7.6 g/dL (ref 6.0–8.3)

## 2024-03-17 LAB — LIPID PANEL
Cholesterol: 138 mg/dL (ref 0–200)
HDL: 44.9 mg/dL (ref >39.00)
LDL Cholesterol: 79 mg/dL (ref 0–99)
NonHDL: 92.76
Total CHOL/HDL Ratio: 3
Triglycerides: 70 mg/dL (ref 0.0–149.0)
VLDL: 14 mg/dL (ref 0.0–40.0)

## 2024-03-17 LAB — CBC
HCT: 32.5 % — ABNORMAL LOW (ref 36.0–46.0)
Hemoglobin: 11.3 g/dL — ABNORMAL LOW (ref 12.0–15.0)
MCHC: 34.8 g/dL (ref 30.0–36.0)
MCV: 95 fl (ref 78.0–100.0)
Platelets: 203 K/uL (ref 150.0–400.0)
RBC: 3.42 Mil/uL — ABNORMAL LOW (ref 3.87–5.11)
RDW: 12.4 % (ref 11.5–15.5)
WBC: 5.6 K/uL (ref 4.0–10.5)

## 2024-03-17 LAB — TSH: TSH: 0.62 u[IU]/mL (ref 0.35–5.50)

## 2024-03-17 LAB — HEMOGLOBIN A1C: Hgb A1c MFr Bld: 5.3 % (ref 4.6–6.5)

## 2024-03-17 LAB — FERRITIN: Ferritin: 7.6 ng/mL — ABNORMAL LOW (ref 10.0–291.0)

## 2024-03-17 LAB — VITAMIN B12: Vitamin B-12: 617 pg/mL (ref 211–911)

## 2024-03-17 MED ORDER — METFORMIN HCL ER 500 MG PO TB24: 500.0000 mg | ORAL_TABLET | Freq: Two times a day (BID) | ORAL | 5 refills | Status: AC

## 2024-03-17 MED ORDER — BUSPIRONE HCL 5 MG PO TABS: 5.0000 mg | ORAL_TABLET | Freq: Three times a day (TID) | ORAL | 3 refills | Status: AC | PRN

## 2024-03-17 NOTE — Assessment & Plan Note (Signed)
 Chronic Follows with her gynecologist Would prefer not to take any hormones Did take metformin  in the past for approximately 1 month but did not seem that it really helped so she stopped it I do think that going back on metformin  would help with her heavy menses, bleeding with ovulation, painful.'s, insulin  resistance and hopefully weight loss Start metformin  XR 500 mg twice daily-stressed that she needs to take this for a few months to see if it truly helps

## 2024-03-17 NOTE — Assessment & Plan Note (Signed)
 Chronic Lab Results  Component Value Date   HGBA1C 5.7 01/20/2023   Check a1c Low sugar / carb diet Stressed regular exercise

## 2024-03-17 NOTE — Assessment & Plan Note (Signed)
 Chronic BMI 38.96 Has lost significant weight on her own and is doing everything she should be doing Insulin  resistance/PCOS is likely contributing to her difficulty losing weight Advise decreasing calorie intake slightly hide continue regular exercise Start metformin  XR 500 mg twice daily

## 2024-03-17 NOTE — Assessment & Plan Note (Signed)
 History of anemia Having heavy menses Check CBC, iron panel, vitamin B12

## 2024-03-17 NOTE — Assessment & Plan Note (Signed)
 Acute on chronic History of anxiety-generalized Has been experiencing some symptoms consistent with panic attacks Does not feel like she needs something on a daily basis Will start buspirone 5 mg 3 times daily as needed She is seeing a therapist and will continue to see a therapist Continue regular exercise

## 2024-03-20 ENCOUNTER — Ambulatory Visit: Payer: Self-pay | Admitting: Internal Medicine
